# Patient Record
Sex: Male | Born: 1935 | Race: White | Hispanic: No | Smoking: Former smoker
Health system: Southern US, Community
[De-identification: ages and names within clinical notes are randomized; demographics above are authoritative.]

## PROBLEM LIST (undated history)

## (undated) DIAGNOSIS — I639 Cerebral infarction, unspecified: Secondary | ICD-10-CM

## (undated) DIAGNOSIS — I10 Essential (primary) hypertension: Secondary | ICD-10-CM

## (undated) DIAGNOSIS — F0390 Unspecified dementia without behavioral disturbance: Secondary | ICD-10-CM

## (undated) HISTORY — PX: PROSTATE SURGERY: SHX751

## (undated) HISTORY — PX: WRIST SURGERY: SHX841

---

## 1999-03-25 ENCOUNTER — Encounter: Payer: Self-pay | Admitting: Urology

## 1999-03-27 ENCOUNTER — Inpatient Hospital Stay (HOSPITAL_COMMUNITY): Admission: RE | Admit: 1999-03-27 | Discharge: 1999-03-30 | Payer: Self-pay | Admitting: Urology

## 2005-06-20 ENCOUNTER — Emergency Department (HOSPITAL_COMMUNITY): Admission: EM | Admit: 2005-06-20 | Discharge: 2005-06-20 | Payer: Self-pay | Admitting: Emergency Medicine

## 2006-07-14 ENCOUNTER — Ambulatory Visit (HOSPITAL_COMMUNITY): Admission: RE | Admit: 2006-07-14 | Discharge: 2006-07-14 | Payer: Self-pay | Admitting: Cardiology

## 2006-09-03 ENCOUNTER — Emergency Department (HOSPITAL_COMMUNITY): Admission: EM | Admit: 2006-09-03 | Discharge: 2006-09-03 | Payer: Self-pay | Admitting: Emergency Medicine

## 2008-04-17 ENCOUNTER — Emergency Department (HOSPITAL_COMMUNITY): Admission: EM | Admit: 2008-04-17 | Discharge: 2008-04-17 | Payer: Self-pay | Admitting: Emergency Medicine

## 2011-01-01 ENCOUNTER — Encounter (INDEPENDENT_AMBULATORY_CARE_PROVIDER_SITE_OTHER): Payer: Self-pay | Admitting: *Deleted

## 2011-01-08 NOTE — Letter (Signed)
Summary: New Patient letter  [facility]  [address]   [place], [place] [address]   Phone: [phone]  Fax: [phone]       01/01/2011 MRN: [id number]  [facility] [address] [place], [place]  [address]  Dear Mr. AUMAN,  Welcome to the Gastroenterology Division at [facility].    You are scheduled to see Dr.  Jarold Motto on 02/03/2011 at 8:30 on the 3rd floor at [facility], [address]  We ask that you try to arrive at our office 15 minutes prior to your appointment time to allow for check-in.  We would like you to complete the enclosed self-administered evaluation form prior to your visit and bring it with you on the day of your appointment.  We will review it with you.  Also, please bring a complete list of all your medications or, if you prefer, bring the medication bottles and we will list them.  Please bring your insurance card so that we may make a copy of it.  If your insurance requires a referral to see a specialist, please bring your referral form from your primary care physician.  Co-payments are due at the time of your visit and may be paid by cash, check or credit card.     Your office visit will consist of a consult with your physician (includes a physical exam), any laboratory testing he/she may order, scheduling of any necessary diagnostic testing (e.g. x-ray, ultrasound, CT-scan), and scheduling of a procedure (e.g. Endoscopy, Colonoscopy) if required.  Please allow enough time on your schedule to allow for any/all of these possibilities.    If you cannot keep your appointment, please call [phone] to cancel or reschedule prior to your appointment date.  This allows [place] the opportunity to schedule an appointment for another patient in need of care.  If you do not cancel or reschedule by 5 p.m. the business day prior to your appointment date, you will be charged a $50.00 late cancellation/no-show fee.    Thank you for choosing  [facility] for your medical needs.  We appreciate the opportunity to care for you.  Please visit [place] at our website  to learn more about our practice.                     Sincerely,                                                             The Gastroenterology Division

## 2011-01-29 DIAGNOSIS — I672 Cerebral atherosclerosis: Secondary | ICD-10-CM | POA: Insufficient documentation

## 2011-01-29 DIAGNOSIS — I6350 Cerebral infarction due to unspecified occlusion or stenosis of unspecified cerebral artery: Secondary | ICD-10-CM | POA: Insufficient documentation

## 2011-01-29 DIAGNOSIS — I10 Essential (primary) hypertension: Secondary | ICD-10-CM | POA: Insufficient documentation

## 2011-02-03 ENCOUNTER — Ambulatory Visit (INDEPENDENT_AMBULATORY_CARE_PROVIDER_SITE_OTHER): Payer: Medicare Other | Admitting: Gastroenterology

## 2011-02-03 ENCOUNTER — Other Ambulatory Visit: Payer: Self-pay | Admitting: Gastroenterology

## 2011-02-03 ENCOUNTER — Encounter: Payer: Self-pay | Admitting: Gastroenterology

## 2011-02-03 ENCOUNTER — Encounter (INDEPENDENT_AMBULATORY_CARE_PROVIDER_SITE_OTHER): Payer: Self-pay | Admitting: *Deleted

## 2011-02-03 ENCOUNTER — Other Ambulatory Visit: Payer: Medicare Other

## 2011-02-03 DIAGNOSIS — D509 Iron deficiency anemia, unspecified: Secondary | ICD-10-CM

## 2011-02-03 DIAGNOSIS — K219 Gastro-esophageal reflux disease without esophagitis: Secondary | ICD-10-CM

## 2011-02-03 DIAGNOSIS — R413 Other amnesia: Secondary | ICD-10-CM | POA: Insufficient documentation

## 2011-02-03 LAB — CBC WITH DIFFERENTIAL/PLATELET
Basophils Relative: 0.5 % (ref 0.0–3.0)
Eosinophils Relative: 2.5 % (ref 0.0–5.0)
Lymphocytes Relative: 29.4 % (ref 12.0–46.0)
Monocytes Relative: 7.1 % (ref 3.0–12.0)
Neutrophils Relative %: 60.5 % (ref 43.0–77.0)
RBC: 3.68 Mil/uL — ABNORMAL LOW (ref 4.22–5.81)
WBC: 5.8 10*3/uL (ref 4.5–10.5)

## 2011-02-03 LAB — BASIC METABOLIC PANEL
BUN: 30 mg/dL — ABNORMAL HIGH (ref 6–23)
Creatinine, Ser: 1.2 mg/dL (ref 0.4–1.5)
GFR: 59.07 mL/min — ABNORMAL LOW (ref >60.00)
Glucose, Bld: 117 mg/dL — ABNORMAL HIGH (ref 70–99)

## 2011-02-03 LAB — IBC PANEL
Iron: 103 ug/dL (ref 42–165)
Saturation Ratios: 36.7 % (ref 20.0–50.0)

## 2011-02-03 LAB — HEPATIC FUNCTION PANEL
ALT: 9 U/L (ref 0–53)
Alkaline Phosphatase: 44 U/L (ref 39–117)
Bilirubin, Direct: 0.1 mg/dL (ref 0.0–0.3)
Total Bilirubin: 0.7 mg/dL (ref 0.3–1.2)
Total Protein: 6.6 g/dL (ref 6.0–8.3)

## 2011-02-03 LAB — SEDIMENTATION RATE: Sed Rate: 38 mm/hr — ABNORMAL HIGH (ref 0–22)

## 2011-02-03 LAB — TSH: TSH: 1.68 u[IU]/mL (ref 0.35–5.50)

## 2011-02-04 DIAGNOSIS — E538 Deficiency of other specified B group vitamins: Secondary | ICD-10-CM | POA: Insufficient documentation

## 2011-02-12 NOTE — Assessment & Plan Note (Signed)
Summary: CONSULT FOR COLON.William Wade MEDICARE//SCH W/[name] [id number]//MEDLI...   History of Present Illness Visit Type: Initial Consult Primary GI MD: William Bison MD FACP FAGA Primary Provider: [name], MD Requesting Provider: [name], MD Chief Complaint: Patient here to discuss having a consult colonoscopy at age 77. He denies any current GI symptoms. History of Present Illness:   77 year old Caucasian male referred by Dr. Antionette Wade for evaluation of mild normochromic normocytic anemia with hematocrit of 36. The patient denies any GI complaints whatsoever except for chronic hoarseness which she attributes to her previous CVA several years ago. He apparently was not hospitalized and is not on anticoagulants or aspirin. He does complain of memory loss but is able to care for his wife and drive a car.  The patient specifically denies abdominal pain, melena, hematochezia, and/or or upper GI or hepatobiliary complaints. He denies anorexia, weight loss, or systemic complaints. His never had barium studies or endoscopic exams. Family history is noncontributory.   GI Review of Systems      Denies abdominal pain, acid reflux, belching, bloating, chest pain, dysphagia with liquids, dysphagia with solids, heartburn, loss of appetite, nausea, vomiting, vomiting blood, weight loss, and  weight gain.        Denies anal fissure, black tarry stools, change in bowel habit, constipation, diarrhea, diverticulosis, fecal incontinence, heme positive stool, hemorrhoids, irritable bowel syndrome, jaundice, light color stool, liver problems, rectal bleeding, and  rectal pain. Preventive Screening-Counseling & Management  Alcohol-Tobacco     Smoking Status: quit  Caffeine-Diet-Exercise     Does Patient Exercise: no    Current Medications (verified): 1)  None  Allergies (verified): No Known Drug Allergies  Past History:  Past medical, surgical, family and social histories (including risk  factors) reviewed for relevance to current acute and chronic problems.  Past Medical History: Current Problems:  MEMORY LOSS (ICD-780.93) ESSENTIAL HYPERTENSION, BENIGN (ICD-401.1) CVA (ICD-434.91) CEREBRAL ATHEROSCLEROSIS (ICD-437.0)  Past Surgical History: Cataract Extractions-bilateral Prostate Surgery  Family History: Reviewed history and no changes required. Patient unable to remember much family history Had 87 sisters  Social History: Reviewed history from 01/29/2011 and no changes required. Alcohol Use - no Illicit Drug Use - no Patient is a former smoker. -stopped 1985 Patient does not get regular exercise.  Smoking Status:  quit Does Patient Exercise:  no  Review of Systems General:  Denies fever, chills, sweats, anorexia, fatigue, weakness, malaise, weight loss, and sleep disorder. Eyes:  Denies blurring, diplopia, irritation, discharge, vision loss, scotoma, eye pain, and photophobia. CV:  Denies chest pains, angina, palpitations, syncope, dyspnea on exertion, orthopnea, PND, peripheral edema, and claudication. Resp:  Denies dyspnea at rest, dyspnea with exercise, cough, sputum, wheezing, coughing up blood, and pleurisy. GI:  Denies difficulty swallowing, pain on swallowing, nausea, indigestion/heartburn, vomiting, vomiting blood, abdominal pain, jaundice, gas/bloating, diarrhea, constipation, change in bowel habits, bloody BM's, black BMs, and fecal incontinence. GU:  Denies urinary burning, blood in urine, urinary frequency, urinary hesitancy, nocturnal urination, urinary incontinence, penile discharge, genital sores, decreased libido, and erectile dysfunction; previous TURP for BPH. He denies any current genitourinary complaints.. MS:  Denies joint pain / LOM, joint swelling, joint stiffness, joint deformity, low back pain, muscle weakness, muscle cramps, muscle atrophy, leg pain at night, leg pain with exertion, and shoulder pain / LOM hand / wrist pain (CTS). Neuro:   Complains of weakness and difficulty walking; denies paralysis, abnormal sensation, seizures, syncope, tremors, vertigo, transient blindness, frequent falls, frequent headaches, headache, sciatica, radiculopathy other:, restless legs,  memory loss, and confusion. Psych:  Complains of memory loss and confusion; denies depression, anxiety, suicidal ideation, hallucinations, paranoia, and phobia. Heme:  Denies bruising, bleeding, enlarged lymph nodes, and pagophagia.  Vital Signs:  Patient profile:   77 year old male Height:      74 inches Weight:      203 pounds BMI:     26.16 BSA:     2.19 Pulse rate:   76 / minute Pulse rhythm:   irregular BP sitting:   164 / 80  (left arm)  Vitals Entered By: Lamona Curl CMA Duncan Dull) (February 03, 2011 8:29 AM)  Physical Exam  General:  Well developed, well nourished, no acute distress. Head:  Normocephalic and atraumatic. Eyes:  PERRLA, no icterus.exam deferred to patient's ophthalmologist.   Mouth:  No deformity or lesions, dentition normal. Neck:  Supple; no masses or thyromegaly. Lungs:  Clear throughout to auscultation. Heart:  Regular rate and rhythm; no murmurs, rubs,  or bruits. Abdomen:  Soft, nontender and nondistended. No masses, hepatosplenomegaly or hernias noted. Normal bowel sounds. Rectal:  deferreddeferred until time of colonoscopy.   Msk:  Symmetrical with no gross deformities. Normal posture. Pulses:  Normal pulses noted. Extremities:  No clubbing, cyanosis, edema or deformities noted. Neurologic:  Alert and  oriented x4;  grossly normal neurologically.He appears to have fairly good short and long-term memory to my exam. Skin:  Intact without significant lesions or rashes. Cervical Nodes:  No significant cervical adenopathy. Psych:  Alert and cooperative. Normal mood and affect.   Impression & Recommendations:  Problem # 1:  GERD (ICD-530.81) Assessment Unchanged chronic hoarseness perhaps related to his previous CVA  versus acid reflux disease. Examination the oropharynx today was unremarkable but incomplete. I have empirically placed him on AcipHex 20 mg a day. Ideally he should have endoscopic exam. Orders: TLB-CBC Platelet - w/Differential (85025-CBCD) TLB-Hepatic/Liver Function Pnl (80076-HEPATIC) TLB-BMP (Basic Metabolic Panel-BMET) (80048-METABOL) TLB-TSH (Thyroid Stimulating Hormone) (84443-TSH) TLB-Ferritin (82728-FER) TLB-B12, Serum-Total ONLY ([address]-U04) TLB-Folic Acid (Folate) (82746-FOL) TLB-IBC Pnl (Iron/FE;Transferrin) (83550-IBC) TLB-Sedimentation Rate (ESR) (85652-ESR)  Problem # 2:  ANEMIA, IRON DEFICIENCY (ICD-280.9) Assessment: Unchanged repeat labs and anemia profile ordered.. Cards for detection of human hemoglobin in his stools also requested. Patient needs colonoscopy but he says he cannot perform such because he has no one that can stay with him during his exam would drive him home afterwards. This is a work in progress. Orders: TLB-CBC Platelet - w/Differential (85025-CBCD) TLB-Hepatic/Liver Function Pnl (80076-HEPATIC) TLB-BMP (Basic Metabolic Panel-BMET) (80048-METABOL) TLB-TSH (Thyroid Stimulating Hormone) (84443-TSH) TLB-Ferritin (82728-FER) TLB-B12, Serum-Total ONLY (54098-J19) TLB-Folic Acid (Folate) (82746-FOL) TLB-IBC Pnl (Iron/FE;Transferrin) (83550-IBC) TLB-Sedimentation Rate (ESR) (85652-ESR)  Problem # 3:  MEMORY LOSS (ICD-780.93) Assessment: Deteriorated neurology referral requested. Also repeat labs including B12 level ordered. With his history of previous TIAs, he probably should be on aspirin therapy. I will leave this up to his neurologist.He denies any history of cigarette or ethanol abuse. Family history is also unremarkable in terms of any known neurological problems. Orders: [facility]  Problem # 4:  ESSENTIAL HYPERTENSION, BENIGN (ICD-401.1) Assessment: Unchanged blood pressure today 164/80. I cannot see where he  is on any antihypertensive medication.  Problem # 5:  CEREBRAL ATHEROSCLEROSIS (ICD-437.0) Assessment: Comment Only  Patient Instructions: 1)  Copy sent to : [name], MD 2)  Please go to the basement today for your labs.  3)  Your prescription(s) have been sent to you pharmacy.   4)  Take Aciphex one by mouth once daily  5)  We will contact you about the referral to Neurologist, for your memory loss.  6)  The medication list was reviewed and reconciled.  All changed / newly prescribed medications were explained.  A complete medication list was provided to the patient / caregiver. Prescriptions: ACIPHEX 20 MG TBEC (RABEPRAZOLE SODIUM) take one by mouth once daily  #30 x 6   Entered by:   Harlow Mares CMA (AAMA)   Authorized by:   Mardella Layman MD [facility]   Signed by:   Mardella Layman MD [facility] on 02/03/2011   Method used:   Electronically to        [facility]. [phone]* (retail)       [address]       [place], [place]  [address]       Ph: [phone]       Fax: [phone]   RxID:   [id number]

## 2011-02-12 NOTE — Letter (Signed)
Summary: [facility]: Immunoassay Fecal Occult Blood (iFOB) Order Form  [facility]  [address] [place], [place] [address]   Phone: [phone]  Fax: [phone]      [facility]: Immunoassay Fecal Occult Blood (iFOB) Order Form   February 03, 2011 MRN: [id number]   TORIN MODICA 07/01/1925   Physicican Name: [name]  Diagnosis Code: 280.9     Harlow Mares CMA (AAMA)

## 2011-03-06 ENCOUNTER — Other Ambulatory Visit: Payer: PRIVATE HEALTH INSURANCE

## 2011-03-06 DIAGNOSIS — D509 Iron deficiency anemia, unspecified: Secondary | ICD-10-CM

## 2011-04-21 NOTE — Op Note (Signed)
NAMECHRISTIANJAMES, [name] NO.:  [id number]   MEDICAL RECORD NO.:  [id number]          PATIENT TYPE:  EMS   LOCATION:  [facility]JO                         FACILITY:  [facility]   PHYSICIAN:  William Wade, M.D.DATE OF BIRTH:  22-Oct-1925   DATE OF PROCEDURE:  04/17/2008  DATE OF DISCHARGE:  04/17/2008                               OPERATIVE REPORT   William Wade is an 77 year old male who presents to the [facility]  Emergency Room for evaluation of his left wrist.  He fell on  outstretched hand and sustained a displaced distal radius fracture.  I  was consulted by Dr. Cathren Wade to see and treat this patient.  I  appreciate the kind referral.  The patient denies Wade injury.  He is  alert and oriented.   The patient's past medical history is significant for two questionable  TIAs/stroke-type episodes.  He currently does not see a medical doctor  and does not take medicines.   MEDICINES:  None.   ALLERGIES:  None.   SOCIAL HISTORY:  He does not smoke or drink.  He lives with his wife for  many years.  He still drives a car and is independent.  He is a right-  hand dominant.   PAST SURGICAL HISTORY:  Prostate surgery 10 years ago.   PHYSICAL EXAMINATION:  On examination, he is alert and oriented in no  acute distress.  Vital signs are stable.  The patient has full sensation  to the hand.  Obvious deformity about the left wrist.  Elbow is stable.  There is no signs of infection, dystrophy, or vascular compromise.  He  is stable to ligamentous examination about the elbow, forearm, and  shoulder.  Obvious deformity about the wrist is noted with no evidence  of compartment syndrome.  Right upper extremity is neurovascularly  intact.  Normal alignment and normal range of motion.   X-rays reviewed, show displaced distal radius fracture, comminuted with  significant displacement.   IMPRESSION:  Comminuted greater than three-part distal radius fracture  left upper  extremity.   PLAN:  Verbally consented for reduction.  He was taken to procedural  region, underwent a hematoma block sterilely under my direction.  Following this, I performed a three-point mold technique and reduction  of the wrist.  Postreduction x-rays looked satisfactory.  I discussed  with him these issues and I discharged him on recommendations of ice,  elevation, finger range of motion, cautious use only return to see me in  7 days.  If this falls  and collapses, I would consider more aggressive approach such as  surgical intervention.  I have given Vicodin p.r.n. pain, and he  understands all do's and don't, etc.  It has been a pleasure to see him  today.  All questions have been encouraged and answered.           ______________________________  William Wade, M.D.     William Wade  D:  04/17/2008  T:  04/18/2008  Job:  [id number]

## 2013-06-28 ENCOUNTER — Emergency Department (HOSPITAL_COMMUNITY)
Admission: EM | Admit: 2013-06-28 | Discharge: 2013-06-28 | Disposition: A | Discharge status: Home / Self Care | Payer: Medicare Other | Attending: Emergency Medicine | Admitting: Emergency Medicine

## 2013-06-28 ENCOUNTER — Encounter (HOSPITAL_COMMUNITY): Payer: Self-pay | Admitting: Emergency Medicine

## 2013-06-28 ENCOUNTER — Emergency Department (HOSPITAL_COMMUNITY): Payer: Medicare Other

## 2013-06-28 DIAGNOSIS — Z87891 Personal history of nicotine dependence: Secondary | ICD-10-CM | POA: Insufficient documentation

## 2013-06-28 DIAGNOSIS — S61409A Unspecified open wound of unspecified hand, initial encounter: Secondary | ICD-10-CM | POA: Insufficient documentation

## 2013-06-28 DIAGNOSIS — Y929 Unspecified place or not applicable: Secondary | ICD-10-CM | POA: Insufficient documentation

## 2013-06-28 DIAGNOSIS — IMO0002 Reserved for concepts with insufficient information to code with codable children: Secondary | ICD-10-CM | POA: Insufficient documentation

## 2013-06-28 DIAGNOSIS — T148XXA Other injury of unspecified body region, initial encounter: Secondary | ICD-10-CM

## 2013-06-28 DIAGNOSIS — Z8673 Personal history of transient ischemic attack (TIA), and cerebral infarction without residual deficits: Secondary | ICD-10-CM | POA: Insufficient documentation

## 2013-06-28 DIAGNOSIS — Z23 Encounter for immunization: Secondary | ICD-10-CM | POA: Insufficient documentation

## 2013-06-28 DIAGNOSIS — Y939 Activity, unspecified: Secondary | ICD-10-CM | POA: Insufficient documentation

## 2013-06-28 HISTORY — DX: Cerebral infarction, unspecified: I63.9

## 2013-06-28 MED ORDER — TETANUS-DIPHTH-ACELL PERTUSSIS 5-2.5-18.5 LF-MCG/0.5 IM SUSP
0.5000 mL | Freq: Once | INTRAMUSCULAR | Status: AC
  Administered 2013-06-28: 0.5 mL via INTRAMUSCULAR
  Filled 2013-06-28: qty 0.5

## 2013-06-28 NOTE — ED Provider Notes (Signed)
History    88yM brought in by son for evaluation of skin tears to LUE. Pt says caught arm against the side of washing machine shortly before arrival. Denies significant pain. No other complaints. Unsure of last tetanus. Son has concerns though of pt's ability to take care of himself. Currently visiting pt from [place]. Pt very forgetful. House is disorganized. Started looking into placement options.   CSN: [id number] Arrival date & time 06/28/13  1144  First MD Initiated Contact with Patient 06/28/13 1320     Chief Complaint  Patient presents with  . Arm Injury   (Consider location/radiation/quality/duration/timing/severity/associated sxs/prior Treatment) HPI Past Medical History  Diagnosis Date  . Stroke    No past surgical history on file. No family history on file. History  Substance Use Topics  . Smoking status: Former Games developer  . Smokeless tobacco: Not on file  . Alcohol Use: No    Review of Systems  All systems reviewed and negative, other than as noted in HPI.   Allergies  Review of patient's allergies indicates no known allergies.  Home Medications  No current outpatient prescriptions on file. BP 181/68  Pulse 64  Temp(Src) 98.3 F (36.8 C) (Oral)  Resp 18  SpO2 100% Physical Exam  Nursing note and vitals reviewed. Constitutional: He appears well-developed and well-nourished. No distress.  HENT:  Head: Normocephalic and atraumatic.  Eyes: Conjunctivae are normal. Right eye exhibits no discharge. Left eye exhibits no discharge.  Neck: Neck supple.  Cardiovascular: Normal rate, regular rhythm and normal heart sounds.  Exam reveals no gallop and no friction rub.   No murmur heard. Pulmonary/Chest: Effort normal and breath sounds normal. No respiratory distress.  Abdominal: Soft. He exhibits no distension. There is no tenderness.  Musculoskeletal: He exhibits no edema and no tenderness.  Neurological: He is alert.  Skin: Skin is warm and dry.  "V" shaped  skin tear to dorsum of L hand. ~5cm in total length. Minimal underlying bony tenderness. NVI intact distally. Dime sized skin tear L elbow region. Pt able to actively fully extend L elbow. No bony tenderness.   Psychiatric: He has a normal mood and affect. His behavior is normal. Thought content normal.    ED Course  Procedures (including critical care time)  LACERATION REPAIR Performed by: Raeford Razor Authorized by: Raeford Razor Consent: Verbal consent obtained. Risks and benefits: risks, benefits and alternatives were discussed Consent given by: patient Patient identity confirmed: provided demographic data Prepped and Draped in normal sterile fashion Wound explored  Laceration Location: L hand  Laceration Length: 5cm  No Foreign Bodies seen or palpated  Anesthesia: none  Local anesthetic: n/a  Anesthetic total: n/q  Amount of cleaning: standard  Skin closure: steristrips  Number of sutures: 6  Technique: adhesive bandage  Patient tolerance: Patient tolerated the procedure well with no immediate complications.  Labs Reviewed - No data to display Dg Hand Complete Left  06/28/2013   *RADIOLOGY REPORT*  Clinical Data: Laceration left hand.  LEFT HAND - COMPLETE 3+ VIEW  Comparison: Plain films left wrist 04/17/2008.  Findings: No radiopaque foreign body is identified.  No acute fracture is seen.  An old healed distal radius fracture is noted. Scattered degenerative change most notable about the first [facility] joint is noted.  IMPRESSION:  1.  No acute abnormality. 2.  Old healed distal radius fracture.   Original Report Authenticated By: Holley Dexter, M.D.   1. Multiple skin tears     MDM  [phone] with  skin tears to LUE. Minimal bony tenderness of hand and imaging w/o fx. NV intact. Repaired with steristrips. Continued wound care. Tetanus updated. Pt lives alone and son with concerns with pt's ability to take care of self. Son visiting from [place]. No local family. Pt  with no PCP. Social work consulted to speak with pt/son in terms of possible additional assistance or alternative living arrangements.   Social work met with pt. Recommending determination of capacity. Explained to son that this is done by psychiatry and that potentially may be extended period of time before this is completed depending on how many consults are ahead of him. Son does not want to wait for this. I feel pt is medically stable for discharge. Home health was arranged prior to DC.   Raeford Razor, MD 07/01/13 [phone]

## 2013-06-28 NOTE — ED Notes (Signed)
Pt to ED from home via EMS for skin tear at left hand and posterior forearm below the elbow. Pt states he bumped his arm to something he does not remember. Pt denies fall. Per son, there is changes in pt mental status since 03/2013 that pt is more confused, not taking meds or follow up with PCP. BP-192/90, HR-82 and regular, CBG-103.

## 2013-06-28 NOTE — Progress Notes (Signed)
CSW met the son about his concerns with Pt's deteriorating mental capacity. Pt's son stated that since his mother passed he has noticed a decline in Pt's mental health and has made an attempt on placing Pt in a facility, however the Pt is unsure about placement at this time. Pt continues to decline and is unable to manage his finances. Pt has been found confused, however denies any memory loss.   Pt suffered an injury to his hand and was brought to the ED. Pt's son has requested a capacity screen to see if the Pt is able to remain on his own, as the [name]. [phone]) will need to return to [place] in the a.m. And is concerned about Pt's safety.   CSW notified ED MD and oncoming CSW for assist with Pt's sons request and possible placement.   Leron Croak, LCSWA [facility].  [phone]

## 2013-06-28 NOTE — ED Notes (Signed)
Social work at bedside.  

## 2013-06-28 NOTE — ED Notes (Signed)
Pt [facility].

## 2013-06-29 ENCOUNTER — Telehealth (HOSPITAL_COMMUNITY): Payer: Self-pay | Admitting: *Deleted

## 2013-06-29 NOTE — Progress Notes (Signed)
   CARE MANAGEMENT ED NOTE 06/29/2013  Patient:  William Wade, William Wade   Account Number:  [id number]  Date Initiated:  06/28/2013  Documentation initiated by:  Fransico Michael  Subjective/Objective Assessment:   presented to ED with arm lac     Subjective/Objective Assessment Detail:     Action/Plan:   disposition assistance   Action/Plan Detail:   Anticipated DC Date:  06/29/2013     Status Recommendation to Physician:   Result of Recommendation:     In-house referral  Clinical Social Worker   DC Planning Services  CM consult    Choice offered to / List presented to:       [facility] arranged  HH-2 PT  HH-1 RN  HH-6 SOCIAL WORKER      HH agency  [facility]    Status of service:    ED Comments:   ED Comments Detail:  06/28/13-1629-J.Issac Moure,RN,BSN [phone]      Spoke with Dr. Juleen China regarding home health needs. Orders noted for RN,PT, and CSW. Rinaldo Cloud, EDRN notified of plans. Lupita Leash, RN with [facility] notified of new referral. No further needs identified.  06/28/13-1623-J.Apollos Tenbrink,RN,BSN [phone]      In to speak with patient and son regarding potential need for home health services. Son reports that he is from out of state, will be returning home, and that patient has exhibited increasing weakness over last few weeks. Son reports that patient is unsteady and has difficulty maintaining balance at times. Patient uses walker for ambulation but doesn't feel that a walker would be more beneficial. Patient agreed to home health assessments and treatment. Choice of agencies given and [facility]. Will make appropriate referrals.  06/28/13-1617-J.Edis Huish,RN,BSN [phone]     Received referral from [place],RN regarding need for patient to be discharged home with home health services.

## 2013-11-28 ENCOUNTER — Emergency Department (HOSPITAL_COMMUNITY): Payer: Medicare Other

## 2013-11-28 ENCOUNTER — Encounter (HOSPITAL_COMMUNITY): Payer: Self-pay | Admitting: Emergency Medicine

## 2013-11-28 ENCOUNTER — Inpatient Hospital Stay (HOSPITAL_COMMUNITY)
Admission: EM | Admit: 2013-11-28 | Discharge: 2013-12-04 | DRG: 291 | Disposition: A | Discharge status: Skilled Nursing Facility | Payer: Medicare Other | Attending: Family Medicine | Admitting: Family Medicine

## 2013-11-28 DIAGNOSIS — I5021 Acute systolic (congestive) heart failure: Principal | ICD-10-CM

## 2013-11-28 DIAGNOSIS — Z515 Encounter for palliative care: Secondary | ICD-10-CM

## 2013-11-28 DIAGNOSIS — F0280 Dementia in other diseases classified elsewhere without behavioral disturbance: Secondary | ICD-10-CM

## 2013-11-28 DIAGNOSIS — I82402 Acute embolism and thrombosis of unspecified deep veins of left lower extremity: Secondary | ICD-10-CM

## 2013-11-28 DIAGNOSIS — Z66 Do not resuscitate: Secondary | ICD-10-CM | POA: Diagnosis present

## 2013-11-28 DIAGNOSIS — E875 Hyperkalemia: Secondary | ICD-10-CM

## 2013-11-28 DIAGNOSIS — J189 Pneumonia, unspecified organism: Secondary | ICD-10-CM

## 2013-11-28 DIAGNOSIS — R4182 Altered mental status, unspecified: Secondary | ICD-10-CM

## 2013-11-28 DIAGNOSIS — I672 Cerebral atherosclerosis: Secondary | ICD-10-CM

## 2013-11-28 DIAGNOSIS — J811 Chronic pulmonary edema: Secondary | ICD-10-CM

## 2013-11-28 DIAGNOSIS — D509 Iron deficiency anemia, unspecified: Secondary | ICD-10-CM | POA: Diagnosis present

## 2013-11-28 DIAGNOSIS — I6350 Cerebral infarction due to unspecified occlusion or stenosis of unspecified cerebral artery: Secondary | ICD-10-CM

## 2013-11-28 DIAGNOSIS — K219 Gastro-esophageal reflux disease without esophagitis: Secondary | ICD-10-CM

## 2013-11-28 DIAGNOSIS — R64 Cachexia: Secondary | ICD-10-CM | POA: Diagnosis present

## 2013-11-28 DIAGNOSIS — I509 Heart failure, unspecified: Secondary | ICD-10-CM

## 2013-11-28 DIAGNOSIS — E876 Hypokalemia: Secondary | ICD-10-CM

## 2013-11-28 DIAGNOSIS — IMO0002 Reserved for concepts with insufficient information to code with codable children: Secondary | ICD-10-CM

## 2013-11-28 DIAGNOSIS — R404 Transient alteration of awareness: Secondary | ICD-10-CM | POA: Diagnosis present

## 2013-11-28 DIAGNOSIS — R413 Other amnesia: Secondary | ICD-10-CM

## 2013-11-28 DIAGNOSIS — D649 Anemia, unspecified: Secondary | ICD-10-CM

## 2013-11-28 DIAGNOSIS — I10 Essential (primary) hypertension: Secondary | ICD-10-CM

## 2013-11-28 DIAGNOSIS — F0390 Unspecified dementia without behavioral disturbance: Secondary | ICD-10-CM | POA: Diagnosis present

## 2013-11-28 DIAGNOSIS — I824Y9 Acute embolism and thrombosis of unspecified deep veins of unspecified proximal lower extremity: Secondary | ICD-10-CM | POA: Diagnosis present

## 2013-11-28 DIAGNOSIS — G9340 Encephalopathy, unspecified: Secondary | ICD-10-CM

## 2013-11-28 DIAGNOSIS — E46 Unspecified protein-calorie malnutrition: Secondary | ICD-10-CM | POA: Diagnosis present

## 2013-11-28 DIAGNOSIS — I82409 Acute embolism and thrombosis of unspecified deep veins of unspecified lower extremity: Secondary | ICD-10-CM | POA: Diagnosis present

## 2013-11-28 DIAGNOSIS — I4892 Unspecified atrial flutter: Secondary | ICD-10-CM | POA: Diagnosis present

## 2013-11-28 DIAGNOSIS — Z8673 Personal history of transient ischemic attack (TIA), and cerebral infarction without residual deficits: Secondary | ICD-10-CM

## 2013-11-28 DIAGNOSIS — E538 Deficiency of other specified B group vitamins: Secondary | ICD-10-CM

## 2013-11-28 DIAGNOSIS — Z87891 Personal history of nicotine dependence: Secondary | ICD-10-CM

## 2013-11-28 HISTORY — DX: Unspecified dementia, unspecified severity, without behavioral disturbance, psychotic disturbance, mood disturbance, and anxiety: F03.90

## 2013-11-28 HISTORY — DX: Essential (primary) hypertension: I10

## 2013-11-28 LAB — CBC
HCT: 30.5 % — ABNORMAL LOW (ref 39.0–52.0)
MCH: 31.7 pg (ref 26.0–34.0)
MCV: 97.8 fL (ref 78.0–100.0)
Platelets: 192 10*3/uL (ref 150–400)
RBC: 3.12 MIL/uL — ABNORMAL LOW (ref 4.22–5.81)
RDW: 15.8 % — ABNORMAL HIGH (ref 11.5–15.5)
WBC: 8.8 10*3/uL (ref 4.0–10.5)

## 2013-11-28 LAB — CBC WITH DIFFERENTIAL/PLATELET
Basophils Absolute: 0 10*3/uL (ref 0.0–0.1)
Basophils Relative: 0 % (ref 0–1)
HCT: 32.3 % — ABNORMAL LOW (ref 39.0–52.0)
Hemoglobin: 10.8 g/dL — ABNORMAL LOW (ref 13.0–17.0)
Lymphocytes Relative: 10 % — ABNORMAL LOW (ref 12–46)
MCHC: 33.4 g/dL (ref 30.0–36.0)
Monocytes Absolute: 0.9 10*3/uL (ref 0.1–1.0)
Neutro Abs: 9.4 10*3/uL — ABNORMAL HIGH (ref 1.7–7.7)
Neutrophils Relative %: 83 % — ABNORMAL HIGH (ref 43–77)
RDW: 15.5 % (ref 11.5–15.5)
WBC: 11.3 10*3/uL — ABNORMAL HIGH (ref 4.0–10.5)

## 2013-11-28 LAB — URINALYSIS, ROUTINE W REFLEX MICROSCOPIC
Glucose, UA: NEGATIVE mg/dL
Hgb urine dipstick: NEGATIVE
Ketones, ur: 15 mg/dL — AB
Protein, ur: 30 mg/dL — AB
Urobilinogen, UA: 1 mg/dL (ref 0.0–1.0)

## 2013-11-28 LAB — TROPONIN I: Troponin I: <0.3 ng/mL (ref <0.30)

## 2013-11-28 LAB — COMPREHENSIVE METABOLIC PANEL
ALT: 24 U/L (ref 0–53)
AST: 32 U/L (ref 0–37)
Albumin: 2.9 g/dL — ABNORMAL LOW (ref 3.5–5.2)
Alkaline Phosphatase: 65 U/L (ref 39–117)
CO2: 25 mEq/L (ref 19–32)
Chloride: 105 mEq/L (ref 96–112)
Creatinine, Ser: 1.08 mg/dL (ref 0.50–1.35)
GFR calc non Af Amer: 60 mL/min — ABNORMAL LOW (ref >90)
Potassium: 3 mEq/L — ABNORMAL LOW (ref 3.5–5.1)
Total Bilirubin: 1.2 mg/dL (ref 0.3–1.2)

## 2013-11-28 LAB — URINE MICROSCOPIC-ADD ON

## 2013-11-28 LAB — RETICULOCYTES
RBC.: 3.12 MIL/uL — ABNORMAL LOW (ref 4.22–5.81)
Retic Count, Absolute: 96.7 10*3/uL (ref 19.0–186.0)
Retic Ct Pct: 3.1 % (ref 0.4–3.1)

## 2013-11-28 LAB — CREATININE, SERUM: GFR calc Af Amer: 72 mL/min — ABNORMAL LOW (ref >90)

## 2013-11-28 MED ORDER — ONDANSETRON HCL 4 MG PO TABS: 4.0000 mg | ORAL_TABLET | Freq: Four times a day (QID) | ORAL | Status: DC | PRN

## 2013-11-28 MED ORDER — SODIUM CHLORIDE 0.9 % IJ SOLN
3.0000 mL | Freq: Two times a day (BID) | INTRAMUSCULAR | Status: DC
  Administered 2013-11-28 – 2013-12-04 (×7): 3 mL via INTRAVENOUS

## 2013-11-28 MED ORDER — ONDANSETRON HCL 4 MG/2ML IJ SOLN: 4.0000 mg | Freq: Four times a day (QID) | INTRAMUSCULAR | Status: DC | PRN

## 2013-11-28 MED ORDER — ACETAMINOPHEN 325 MG PO TABS: 650.0000 mg | ORAL_TABLET | Freq: Four times a day (QID) | ORAL | Status: DC | PRN

## 2013-11-28 MED ORDER — POTASSIUM CHLORIDE CRYS ER 20 MEQ PO TBCR: 40.0000 meq | EXTENDED_RELEASE_TABLET | Freq: Once | ORAL | Status: DC

## 2013-11-28 MED ORDER — ENOXAPARIN SODIUM 40 MG/0.4ML ~~LOC~~ SOLN
40.0000 mg | SUBCUTANEOUS | Status: DC
  Administered 2013-11-28: 40 mg via SUBCUTANEOUS
  Filled 2013-11-28 (×2): qty 0.4

## 2013-11-28 MED ORDER — ACETAMINOPHEN 650 MG RE SUPP: 650.0000 mg | Freq: Four times a day (QID) | RECTAL | Status: DC | PRN

## 2013-11-28 MED ORDER — POTASSIUM CHLORIDE 10 MEQ/100ML IV SOLN
10.0000 meq | INTRAVENOUS | Status: AC
  Administered 2013-11-28 – 2013-11-29 (×4): 10 meq via INTRAVENOUS
  Filled 2013-11-28 (×4): qty 100

## 2013-11-28 MED ORDER — SODIUM CHLORIDE 0.9 % IJ SOLN
3.0000 mL | Freq: Two times a day (BID) | INTRAMUSCULAR | Status: DC
  Administered 2013-11-28 – 2013-12-03 (×6): 3 mL via INTRAVENOUS

## 2013-11-28 NOTE — H&P (Signed)
[facility] History and Physical  William Wade NWG:[id number] DOB: 1925/04/19 DOA: 11/28/2013  Referring physician: ER physician. PCP: William Fillers, MD as per patient's son PCP is not Dr. Jarold Wade.  Chief Complaint: Change in mental status.  History provider patient's son Mr. William Wade and patient's friend Mr. William Wade.  HPI: William Wade is a 79 y.o. male was brought to the ER the patient was found on the floor in his house. As per patient's son with whom I spoke over the phone patient has not been doing well for last 3 days and has been found to be having change in mental status has found the patient's grandsons 3 days ago. As per patient's friend Mr. William Wade patient had a fall yesterday. And today was found on the floor and found weak. He was brought to the ER. In the ER CT head did not show anything acute. Chest x-ray shows congestion concerning for CHF. Patient is on exam found to be mildly short of breath. He is oriented to his name and follows commands and nonfocal. Patient denies any chest pain abdominal pain nausea vomiting diarrhea. Patient does have history of progressive dementia which has worsened after his wife's death this 02/19/2023. Patient usually lives alone and drives. His finances are managed by son. He does not cook. Patient has not wanted to go to any physicians. His son has arranged house visiting physicians.  Review of Systems: As presented in the history of presenting illness, rest negative.  Past Medical History  Diagnosis Date  . Stroke   . Dementia    Past Surgical History  Procedure Laterality Date  . Wrist surgery    . Prostate surgery     Social History:  reports that he has quit smoking. He does not have any smokeless tobacco history on file. He reports that he does not drink alcohol or use illicit drugs. Where does patient live home. Can patient participate in ADLs? Yes.  No Known Allergies  Family History:  Family History  Problem Relation  Age of Onset  . Lung cancer Father       Prior to Admission medications   Not on File    Physical Exam: Filed Vitals:   11/28/13 1745 11/28/13 1845 11/28/13 1945 11/28/13 2045  BP: 154/71 141/70 144/69 149/69  Pulse: 81 73 76 80  Temp:      TempSrc:      Resp: 14 15 18 17   SpO2: 99% 96% 98% 100%     General:  Well developed and nourished.  Eyes: Anicteric no pallor.  ENT: No discharge from ears eyes nose mouth.  Neck: No mass felt.  Cardiovascular: S1-S2 heard.  Respiratory: Mild expiratory wheeze heard and crepitations.  Abdomen: Soft nontender bowel sounds present.  Skin: No rash.  Musculoskeletal: Bilateral lower extremity edema.  Psychiatric: Patient is oriented to his name and follows commands.  Neurologic: Patient is oriented to his name and follows commands and moves all extremities 5 x 5. No facial asymmetry. Tongue is midline.  Labs on Admission:  Basic Metabolic Panel:  Recent Labs Lab 11/28/13 1446  NA 143  K 3.0*  CL 105  CO2 25  GLUCOSE 121*  BUN 37*  CREATININE 1.08  CALCIUM 8.8   Liver Function Tests:  Recent Labs Lab 11/28/13 1446  AST 32  ALT 24  ALKPHOS 65  BILITOT 1.2  PROT 6.1  ALBUMIN 2.9*   No results found for this basename: LIPASE, AMYLASE,  in the last 168 hours No  results found for this basename: AMMONIA,  in the last 168 hours CBC:  Recent Labs Lab 11/28/13 1446  WBC 11.3*  NEUTROABS 9.4*  HGB 10.8*  HCT 32.3*  MCV 97.6  PLT 185   Cardiac Enzymes:  Recent Labs Lab 11/28/13 1446  TROPONINI <0.30    BNP (last 3 results)  Recent Labs  11/28/13 1446  PROBNP 12187.0*   CBG: No results found for this basename: GLUCAP,  in the last 168 hours  Radiological Exams on Admission: Ct Head Wo Contrast  11/28/2013   CLINICAL DATA:  Altered mental status, history of stroke  EXAM: CT HEAD WITHOUT CONTRAST  TECHNIQUE: Contiguous axial images were obtained from the base of the skull through the vertex  without intravenous contrast.  COMPARISON:  MRI brain dated 07/14/2006  FINDINGS: No evidence of parenchymal hemorrhage or extra-axial fluid collection. No mass lesion, mass effect, or midline shift.  No CT evidence of acute infarction. Encephalomalacic changes related to prior left MCA distribution infarct.  Subcortical white matter and periventricular small vessel ischemic changes. Intracranial atherosclerosis.  Global cortical atrophy.  No ventriculomegaly.  The visualized paranasal sinuses are essentially clear. The mastoid air cells are unopacified.  No evidence of calvarial fracture.  IMPRESSION: No evidence of acute intracranial abnormality.  Encephalomalacic changes related to prior left MCA distribution infarct.  Atrophy with small vessel ischemic changes and intracranial atherosclerosis.   Electronically Signed   By: Charline Bills M.D.   On: 11/28/2013 19:26   Dg Chest Port 1 View  11/28/2013   CLINICAL DATA:  Acute mental status changes.  EXAM: PORTABLE CHEST - 1 VIEW  COMPARISON:  None.  FINDINGS: The lung bases are not included on this study. Visualized portion of the long bones demonstrate enlargement of the cardiac silhouette. There is prominence of the interstitial markings, indistinctness of the pulmonary vasculature, and peribronchial cuffing. The osseous structures demonstrate degenerative changes within the right lobe shoulders. No focal regions of consolidation appreciated.  IMPRESSION: Interstitial infiltrate likely reflecting pulmonary edema within the visualized portions of the chest.   Electronically Signed   By: Salome Holmes M.D.   On: 11/28/2013 15:40    EKG: Independently reviewed. Normal sinus rhythm with PVCs. QTC is 517.  Assessment/Plan Active Problems:   Acute encephalopathy   CHF (congestive heart failure)   Hypokalemia   Anemia   History of CVA (cerebrovascular accident)   1. Acute encephalopathy in a patient with dementia - CT head was negative for anything  acute. We will check RPR TSH and B12 and folate levels. Check ammonia levels an MRI brain. Patient is afebrile and does not look infected. 2. Decompensated CHF - patient has bilateral lower extremity edema and on exam patient does have wheeze and crepitations. After correcting potassium patient is to be placed on Lasix IV. Cycle cardiac markers. Check 2-D echo. 3. Hypokalemia - replace and recheck. Check magnesium levels. 4. Anemia - as per patient's son patient has chronic anemia. Per the notes reviewed in the chart patient has previously refused a colonoscopy. Check anemia panel and closely follow CBC. 5. Previous history of CVA - presently I have ordered MRI brain for his change in mental status.    Code Status: DO NOT RESUSCITATE. Family Communication: Discussed with patient's son Mr. William Wade over the phone. Mr. William Wade is also the patient's health care power of attorney. Could be reached at [phone].  Disposition Plan: Admit to inpatient.    William Wade N. [facility] Pager [phone].  If 7PM-7AM, please contact night-coverage [email or web address] Password [facility] 11/28/2013, 9:19 PM

## 2013-11-28 NOTE — ED Notes (Signed)
Pt arrives via [facility], pt was found home by family friend "kneeling over" and couldn't get up. Pt was covered in feces, white coating on tongue, and dry membranes EMS reports. Pt son states pt has hx of "dementia" and lives alone, refuses to take medicines, see doctors, or go to a nursing home after wife/caregiver passed away. Pt has hx of stroke 7 years ago according to son. Pt is alert and oriented, nad noted.

## 2013-11-28 NOTE — ED Notes (Signed)
Spoke with William Wade in lab who states she will add BNP to blood samples already obtained.

## 2013-11-28 NOTE — ED Notes (Signed)
Stroke swallow screen done at 1524

## 2013-11-28 NOTE — ED Notes (Signed)
Called lab in regards to BNP results, Alison Stalling states she will add it on to blood samples already obtained. Dr. Loretha Stapler made aware of delay.

## 2013-11-28 NOTE — ED Provider Notes (Signed)
CSN: [id number]     Arrival date & time 11/28/13  1411 History   First MD Initiated Contact with Patient 11/28/13 1420     Chief Complaint  Patient presents with  . Altered Mental Status   (Consider location/radiation/quality/duration/timing/severity/associated sxs/prior Treatment) Patient is a 79 y.o. male presenting with altered mental status.  Altered Mental Status Presenting symptoms: confusion   Presenting symptoms comment:  Weakness Severity:  Unable to specify Episode history:  Unable to specify Timing:  Unable to specify Context: dementia   Context comment:  Lives at home alone Associated symptoms: no abdominal pain, no fever and no vomiting   Associated symptoms comment:  Denies chest pain, cough, SOB, abdominal pain   Past Medical History  Diagnosis Date  . Stroke    History reviewed. No pertinent past surgical history. History reviewed. No pertinent family history. History  Substance Use Topics  . Smoking status: Former Games developer  . Smokeless tobacco: Not on file  . Alcohol Use: No    Review of Systems  Constitutional: Negative for fever.  HENT: Negative for congestion.   Respiratory: Negative for cough and shortness of breath.   Cardiovascular: Negative for chest pain.  Gastrointestinal: Negative for vomiting and abdominal pain.  Psychiatric/Behavioral: Positive for confusion.  All other systems reviewed and are negative.    Allergies  Review of patient's allergies indicates no known allergies.  Home Medications  No current outpatient prescriptions on file. BP 117/47  Pulse 85  Temp(Src) 97.7 F (36.5 C) (Oral)  Resp 24  SpO2 98% Physical Exam  Nursing note and vitals reviewed. Constitutional: He appears well-developed and well-nourished. No distress.  Elderly  HENT:  Head: Normocephalic and atraumatic.  Mouth/Throat: Oropharynx is clear and moist.  Eyes: Conjunctivae are normal. Pupils are equal, round, and reactive to light. No scleral  icterus.  Neck: Neck supple.  Cardiovascular: Normal rate, regular rhythm, normal heart sounds and intact distal pulses.   No murmur heard. Pulmonary/Chest: Effort normal and breath sounds normal. No stridor. No respiratory distress. He has no wheezes. He has no rales.  Abdominal: Soft. He exhibits no distension. There is no tenderness.  Musculoskeletal: Normal range of motion. He exhibits edema (2+ BLE).  Neurological: He is alert. He is disoriented (disoriented to place, time, situation).  Skin: Skin is warm and dry. No rash noted.  Psychiatric: He has a normal mood and affect. His behavior is normal.    ED Course  Procedures (including critical care time) Labs Review Labs Reviewed  CBC WITH DIFFERENTIAL - Abnormal; Notable for the following:    WBC 11.3 (*)    RBC 3.31 (*)    Hemoglobin 10.8 (*)    HCT 32.3 (*)    Neutrophils Relative % 83 (*)    Neutro Abs 9.4 (*)    Lymphocytes Relative 10 (*)    All other components within normal limits  COMPREHENSIVE METABOLIC PANEL - Abnormal; Notable for the following:    Potassium 3.0 (*)    Glucose, Bld 121 (*)    BUN 37 (*)    Albumin 2.9 (*)    GFR calc non Af Amer 60 (*)    GFR calc Af Amer 70 (*)    All other components within normal limits  URINALYSIS, ROUTINE W REFLEX MICROSCOPIC - Abnormal; Notable for the following:    Color, Urine AMBER (*)    Bilirubin Urine SMALL (*)    Ketones, ur 15 (*)    Protein, ur 30 (*)  All other components within normal limits  PRO B NATRIURETIC PEPTIDE - Abnormal; Notable for the following:    Pro B Natriuretic peptide (BNP) 12187.0 (*)    All other components within normal limits  URINE MICROSCOPIC-ADD ON - Abnormal; Notable for the following:    Squamous Epithelial / LPF MANY (*)    All other components within normal limits  TROPONIN I   Imaging Review Ct Head Wo Contrast  11/28/2013   CLINICAL DATA:  Altered mental status, history of stroke  EXAM: CT HEAD WITHOUT CONTRAST   TECHNIQUE: Contiguous axial images were obtained from the base of the skull through the vertex without intravenous contrast.  COMPARISON:  MRI brain dated 07/14/2006  FINDINGS: No evidence of parenchymal hemorrhage or extra-axial fluid collection. No mass lesion, mass effect, or midline shift.  No CT evidence of acute infarction. Encephalomalacic changes related to prior left MCA distribution infarct.  Subcortical white matter and periventricular small vessel ischemic changes. Intracranial atherosclerosis.  Global cortical atrophy.  No ventriculomegaly.  The visualized paranasal sinuses are essentially clear. The mastoid air cells are unopacified.  No evidence of calvarial fracture.  IMPRESSION: No evidence of acute intracranial abnormality.  Encephalomalacic changes related to prior left MCA distribution infarct.  Atrophy with small vessel ischemic changes and intracranial atherosclerosis.   Electronically Signed   By: Charline Bills M.D.   On: 11/28/2013 19:26   Dg Chest Port 1 View  11/28/2013   CLINICAL DATA:  Acute mental status changes.  EXAM: PORTABLE CHEST - 1 VIEW  COMPARISON:  None.  FINDINGS: The lung bases are not included on this study. Visualized portion of the long bones demonstrate enlargement of the cardiac silhouette. There is prominence of the interstitial markings, indistinctness of the pulmonary vasculature, and peribronchial cuffing. The osseous structures demonstrate degenerative changes within the right lobe shoulders. No focal regions of consolidation appreciated.  IMPRESSION: Interstitial infiltrate likely reflecting pulmonary edema within the visualized portions of the chest.   Electronically Signed   By: Salome Holmes M.D.   On: 11/28/2013 15:40  All radiology studies independently viewed by me.     EKG Interpretation    Date/Time:  Tuesday November 28 2013 14:19:46 EST Ventricular Rate:  87 PR Interval:  176 QRS Duration: 97 QT Interval:  430 QTC Calculation: 517 R  Axis:   -60 Text Interpretation:  Sinus rhythm Ventricular premature complex Inferior infarct, old Anteroseptal infarct, age indeterminate Prolonged QT interval pvc is new compared to prior QTc is more prolonged compared to prior Confirmed by [facility]  MD, TREY ([id number]) on 11/28/2013 7:41:52 PM            MDM   1. Altered mental status   2. Pulmonary edema    79 yo male with reported hx of dementia presenting because a friend found him weak, disheveled, and covered in excrement.  He is alert but disoriented.  I spoke with his friend Elisha Headland) who reported a fall yesterday and noted him to be very weak today, unable to walk or take care of himself.  I also spoke with his son Mikie Misner) via telephone (he lives in [place]) who reports that pt has fairly good functionality at baseline.  His workup in the ED was notable for pulm edema on CXR, elevated BNP, negative troponin, and mild hypokalemia.  Lasix deferred for now while replacing potassium as his respiratory status has remained stable.  Consulted [facility] for admission.      Candyce Churn, MD  11/28/13 2032 

## 2013-11-28 NOTE — ED Notes (Signed)
Spoke with pt and his friend, Bethann Goo re: pt's home situation.  Gardiner Barefoot states that pt was doing well on Sunday when he last visited him and that he has had a sudden decline in his functioning.  Pt has no complaints, but his friend does not think he can walk (pt uses walker at baseline.) Pt refusing placement at this time, but would be agreeable to [facility] if appropriate.  Gardiner Barefoot states he would be willing to take care of pt if son would be agreeable.  Son lives in [place] and pt has no local family.  Pt/friend unsure re: pt's finances to pay for custodial care/ALF care etc.  Per RN, pt is still being worked up, CSW will revisit/follow up with son once disposition is known.

## 2013-11-28 NOTE — ED Notes (Signed)
Simon Llamas (Son) [phone]  Bethann Goo [facility] Friend) [phone]

## 2013-11-29 ENCOUNTER — Encounter (HOSPITAL_COMMUNITY): Payer: Self-pay | Admitting: Physician Assistant

## 2013-11-29 ENCOUNTER — Inpatient Hospital Stay (HOSPITAL_COMMUNITY): Payer: Medicare Other

## 2013-11-29 DIAGNOSIS — I5023 Acute on chronic systolic (congestive) heart failure: Secondary | ICD-10-CM

## 2013-11-29 DIAGNOSIS — I059 Rheumatic mitral valve disease, unspecified: Secondary | ICD-10-CM

## 2013-11-29 DIAGNOSIS — R609 Edema, unspecified: Secondary | ICD-10-CM

## 2013-11-29 DIAGNOSIS — I5021 Acute systolic (congestive) heart failure: Principal | ICD-10-CM

## 2013-11-29 DIAGNOSIS — R4182 Altered mental status, unspecified: Secondary | ICD-10-CM

## 2013-11-29 LAB — CBC WITH DIFFERENTIAL/PLATELET
Basophils Relative: 0 % (ref 0–1)
Eosinophils Absolute: 0.1 10*3/uL (ref 0.0–0.7)
HCT: 31.1 % — ABNORMAL LOW (ref 39.0–52.0)
Hemoglobin: 10.2 g/dL — ABNORMAL LOW (ref 13.0–17.0)
Lymphs Abs: 2 10*3/uL (ref 0.7–4.0)
MCH: 32.3 pg (ref 26.0–34.0)
MCHC: 32.8 g/dL (ref 30.0–36.0)
Monocytes Absolute: 0.8 10*3/uL (ref 0.1–1.0)
Monocytes Relative: 9 % (ref 3–12)
Neutrophils Relative %: 65 % (ref 43–77)
Platelets: 191 10*3/uL (ref 150–400)
RBC: 3.16 MIL/uL — ABNORMAL LOW (ref 4.22–5.81)

## 2013-11-29 LAB — COMPREHENSIVE METABOLIC PANEL
ALT: 21 U/L (ref 0–53)
AST: 27 U/L (ref 0–37)
Albumin: 2.7 g/dL — ABNORMAL LOW (ref 3.5–5.2)
CO2: 26 mEq/L (ref 19–32)
Calcium: 8.5 mg/dL (ref 8.4–10.5)
Creatinine, Ser: 1.13 mg/dL (ref 0.50–1.35)
GFR calc non Af Amer: 57 mL/min — ABNORMAL LOW (ref >90)
Sodium: 144 mEq/L (ref 135–145)
Total Protein: 5.8 g/dL — ABNORMAL LOW (ref 6.0–8.3)

## 2013-11-29 LAB — IRON AND TIBC
Iron: 22 ug/dL — ABNORMAL LOW (ref 42–135)
UIBC: 181 ug/dL (ref 125–400)

## 2013-11-29 LAB — TROPONIN I
Troponin I: 0.7 ng/mL (ref <0.30)
Troponin I: <0.3 ng/mL (ref <0.30)

## 2013-11-29 LAB — TSH: TSH: 1.137 u[IU]/mL (ref 0.350–4.500)

## 2013-11-29 LAB — RPR: RPR Ser Ql: NONREACTIVE

## 2013-11-29 LAB — FERRITIN: Ferritin: 153 ng/mL (ref 22–322)

## 2013-11-29 MED ORDER — FUROSEMIDE 10 MG/ML IJ SOLN
40.0000 mg | Freq: Two times a day (BID) | INTRAMUSCULAR | Status: DC
  Administered 2013-11-29 – 2013-11-30 (×2): 40 mg via INTRAVENOUS
  Filled 2013-11-29 (×4): qty 4

## 2013-11-29 MED ORDER — FUROSEMIDE 10 MG/ML IJ SOLN
20.0000 mg | Freq: Every day | INTRAMUSCULAR | Status: DC
  Filled 2013-11-29: qty 2

## 2013-11-29 MED ORDER — ENALAPRIL MALEATE 2.5 MG PO TABS
2.5000 mg | ORAL_TABLET | Freq: Every day | ORAL | Status: DC
  Administered 2013-11-29 – 2013-11-30 (×2): 2.5 mg via ORAL
  Filled 2013-11-29 (×2): qty 1

## 2013-11-29 MED ORDER — ASPIRIN EC 81 MG PO TBEC
81.0000 mg | DELAYED_RELEASE_TABLET | Freq: Every day | ORAL | Status: DC
  Administered 2013-11-29 – 2013-12-04 (×6): 81 mg via ORAL
  Filled 2013-11-29 (×6): qty 1

## 2013-11-29 MED ORDER — ENOXAPARIN SODIUM 80 MG/0.8ML ~~LOC~~ SOLN
1.0000 mg/kg | Freq: Two times a day (BID) | SUBCUTANEOUS | Status: DC
  Administered 2013-11-30 – 2013-12-03 (×8): 80 mg via SUBCUTANEOUS
  Filled 2013-11-29 (×10): qty 0.8

## 2013-11-29 MED ORDER — POTASSIUM CHLORIDE CRYS ER 20 MEQ PO TBCR
40.0000 meq | EXTENDED_RELEASE_TABLET | Freq: Two times a day (BID) | ORAL | Status: DC
  Administered 2013-11-29 – 2013-12-01 (×4): 40 meq via ORAL
  Filled 2013-11-29 (×5): qty 2

## 2013-11-29 MED ORDER — ENOXAPARIN SODIUM 80 MG/0.8ML ~~LOC~~ SOLN
1.0000 mg/kg | Freq: Two times a day (BID) | SUBCUTANEOUS | Status: AC
  Administered 2013-11-29 (×2): 80 mg via SUBCUTANEOUS
  Filled 2013-11-29 (×2): qty 0.8

## 2013-11-29 MED ORDER — ENSURE COMPLETE PO LIQD
237.0000 mL | Freq: Three times a day (TID) | ORAL | Status: DC
  Administered 2013-11-29 – 2013-12-04 (×7): 237 mL via ORAL

## 2013-11-29 MED ORDER — FUROSEMIDE 10 MG/ML IJ SOLN
20.0000 mg | Freq: Once | INTRAMUSCULAR | Status: AC
  Administered 2013-11-30: 20 mg via INTRAVENOUS

## 2013-11-29 NOTE — Progress Notes (Signed)
[facility] PROGRESS NOTE  William Wade ZOX:[id number] DOB: 08-Aug-1925 DOA: 11/28/2013 PCP: Garlan Fillers, MD  Assessment/Plan: 1-Acute encephalopathy in a patient with dementia ; MRI negative for stroke. UA negative. Chest x ray; pulmonary edema. TSH normal at 1.1,  B-12 at 1040, ammonia at 10.  2-Acute Systolic Heart Failure; Started on IV lasix 40 mg IV BID. Appreciate Dr Gala Romney assistance. Mildly increase troponin. ECHO with Ef 25 to 30 %.  3-Hypokalemia : replete.  4-Anemia - as per patient's son patient has chronic anemia. Per the notes reviewed in the chart patient has previously refused a colonoscopy. Check anemia panel and closely follow CBC.  5-Previous history of CVA - MRI negative for acute stroke.  6-left leg is positive for a non-occlusive short segment of deep vein thrombosis in the distal common femoral vein; start Lovenox , pharmacy to dose.     Code Status: DNR.  Family Communication:  Disposition Plan: Remain in patient.    Consultants:  Cardiology  Procedures: ECHO: Left ventricle: The cavity size was mildly dilated. Wall thickness was normal. Systolic function was severely reduced. The estimated ejection fraction was in the range of 25% to 30%. Diffuse hypokinesis. There is akinesis of the inferior posterior myocardium. Features are consistent with a pseudonormal left ventricular filling pattern, with concomitant abnormal relaxation and increased filling pressure (grade 2 diastolic dysfunction). - Mitral valve: Moderate to severe regurgitation. - Left atrium: The atrium was mildly dilated. - Right atrium: The atrium was mildly dilated. - Pulmonary arteries: Systolic pressure was mildly to moderately increased. PA peak pressure: 50mm Hg (S).    Antibiotics:  none  HPI/Subjective: Alert, sitting in bed, denies dyspnea or chest pain.   Objective: Filed Vitals:   11/29/13 1258  BP: 142/69  Pulse: 86  Temp: 97.5 F (36.4 C)  Resp: 18     Intake/Output Summary (Last 24 hours) at 11/29/13 1619 Last data filed at 11/29/13 1259  Gross per 24 hour  Intake    480 ml  Output    650 ml  Net   -170 ml   Filed Weights   11/28/13 2148 11/29/13 0529  Weight: 82.283 kg (181 lb 6.4 oz) 82.283 kg (181 lb 6.4 oz)    Exam:   General:  Alert in no acute distress.   Cardiovascular: S 1, S 2 RRR  Respiratory: Crackles bilaterally.   Abdomen: BS present, soft, Nt  Musculoskeletal: bilateral LE edema.   Data Reviewed: Basic Metabolic Panel:  Recent Labs Lab 11/28/13 1446 11/28/13 2300 11/29/13 0450  NA 143  --  144  K 3.0*  --  3.6  CL 105  --  107  CO2 25  --  26  GLUCOSE 121*  --  94  BUN 37*  --  34*  CREATININE 1.08 1.05 1.13  CALCIUM 8.8  --  8.5   Liver Function Tests:  Recent Labs Lab 11/28/13 1446 11/29/13 0450  AST 32 27  ALT 24 21  ALKPHOS 65 59  BILITOT 1.2 1.2  PROT 6.1 5.8*  ALBUMIN 2.9* 2.7*   No results found for this basename: LIPASE, AMYLASE,  in the last 168 hours  Recent Labs Lab 11/28/13 2300  AMMONIA 10*   CBC:  Recent Labs Lab 11/28/13 1446 11/28/13 2300 11/29/13 0450  WBC 11.3* 8.8 8.2  NEUTROABS 9.4*  --  5.3  HGB 10.8* 9.9* 10.2*  HCT 32.3* 30.5* 31.1*  MCV 97.6 97.8 98.4  PLT 185 192 191   Cardiac Enzymes:  Recent Labs Lab 11/28/13 1446 11/28/13 2300 11/29/13 0450 11/29/13 0945  TROPONINI <0.30 0.70* 0.46* <0.30   BNP (last 3 results)  Recent Labs  11/28/13 1446  PROBNP 12187.0*   CBG: No results found for this basename: GLUCAP,  in the last 168 hours  No results found for this or any previous visit (from the past 240 hour(s)).   Studies: Ct Head Wo Contrast  11/28/2013   CLINICAL DATA:  Altered mental status, history of stroke  EXAM: CT HEAD WITHOUT CONTRAST  TECHNIQUE: Contiguous axial images were obtained from the base of the skull through the vertex without intravenous contrast.  COMPARISON:  MRI brain dated 07/14/2006  FINDINGS: No  evidence of parenchymal hemorrhage or extra-axial fluid collection. No mass lesion, mass effect, or midline shift.  No CT evidence of acute infarction. Encephalomalacic changes related to prior left MCA distribution infarct.  Subcortical white matter and periventricular small vessel ischemic changes. Intracranial atherosclerosis.  Global cortical atrophy.  No ventriculomegaly.  The visualized paranasal sinuses are essentially clear. The mastoid air cells are unopacified.  No evidence of calvarial fracture.  IMPRESSION: No evidence of acute intracranial abnormality.  Encephalomalacic changes related to prior left MCA distribution infarct.  Atrophy with small vessel ischemic changes and intracranial atherosclerosis.   Electronically Signed   By: Charline Bills M.D.   On: 11/28/2013 19:26   Mr Brain Wo Contrast  11/29/2013   CLINICAL DATA:  Altered mental status. Acute encephalopathy. Dementia. Evaluate for stroke.  EXAM: MRI HEAD WITHOUT CONTRAST  TECHNIQUE: Multiplanar, multiecho pulse sequences of the brain and surrounding structures were obtained without intravenous contrast.  COMPARISON:  CT head 11/28/2013.  MR head 07/14/2006.  FINDINGS: The patient was unable to remain motionless for the exam. Small or subtle lesions could be overlooked.  Chronic encephalomalacia related to old left MCA territory infarction affecting much of the left hemisphere including the frontal, temporal, and parietal lobes, including the basal ganglia. No acute stroke is evident. There is no acute hemorrhage, mass lesion, hydrocephalus, or extra-axial fluid. Generalized cerebral and cerebellar atrophy. Chronic microvascular ischemic change affects the periventricular and subcortical white matter. Prominent perivascular spaces suggest chronic hypertension.  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 Some flow  void signal is seen in the left MCA trunk. This LICA vessel occlusion was not present in 2007.  Pituitary, pineal, and cerebellar tonsils unremarkable. No upper cervical lesions. There may be slight chronic hemorrhage associated with the old left basal ganglia infarct.  No osseous lesions are evident. Upper cervical region unremarkable. Bilateral cataract extraction. No sinus or mastoid disease.  Compared with 2007, there has been progressive atrophy. The left MCA hemispheric infarctions were acute at that time. The left basal ganglia infarct was present before 2007.  IMPRESSION: No acute stroke or other acute intracranial findings.  Remote left MCA territory infarction.  Occluded left internal carotid artery has occurred since 2007. There appears to be collateral flow into the left anterior circulation based on flow void signal within the left MCA.   Electronically Signed   By: Davonna Belling M.D.   On: 11/29/2013 09:53   Dg Chest Port 1 View  11/28/2013   CLINICAL DATA:  Acute mental status changes.  EXAM: PORTABLE CHEST - 1 VIEW  COMPARISON:  None.  FINDINGS: The lung bases are not included on this study. Visualized portion of the long  bones demonstrate enlargement of the cardiac silhouette. There is prominence of the interstitial markings, indistinctness of the pulmonary vasculature, and peribronchial cuffing. The osseous structures demonstrate degenerative changes within the right lobe shoulders. No focal regions of consolidation appreciated.  IMPRESSION: Interstitial infiltrate likely reflecting pulmonary edema within the visualized portions of the chest.   Electronically Signed   By: Salome Holmes M.D.   On: 11/28/2013 15:40    Scheduled Meds: . enoxaparin (LOVENOX) injection  1 mg/kg Subcutaneous BID  . [START ON 11/30/2013] enoxaparin (LOVENOX) injection  1 mg/kg Subcutaneous Q12H  . feeding supplement (ENSURE COMPLETE)  237 mL Oral TID BM  . furosemide  40 mg Intravenous BID  . potassium  chloride  40 mEq Oral BID  . sodium chloride  3 mL Intravenous Q12H  . sodium chloride  3 mL Intravenous Q12H   Continuous Infusions:   Active Problems:   Acute encephalopathy   CHF (congestive heart failure)   Hypokalemia   Anemia   History of CVA (cerebrovascular accident)    Time spent: 30 minutes.     Tynslee Bowlds  [facility] Pager [phone]. If 7PM-7AM, please contact night-coverage at [email or web address], password [facility] 11/29/2013, 4:19 PM  LOS: 1 day

## 2013-11-29 NOTE — Progress Notes (Signed)
ANTICOAGULATION CONSULT NOTE - Initial Consult  Pharmacy Consult for lovenox Indication: DVT  No Known Allergies  Patient Measurements: Height: 6\' 2"  (188 cm) Weight: 181 lb 6.4 oz (82.283 kg) IBW/kg (Calculated) : 82.2   Vital Signs: Temp: 97.4 F (36.3 C) (12/24 0529) Temp src: Oral (12/24 0529) BP: 143/70 mmHg (12/24 0529) Pulse Rate: 85 (12/24 0529)  Labs:  Recent Labs  11/28/13 1446 11/28/13 2300 11/29/13 0450 11/29/13 0945  HGB 10.8* 9.9* 10.2*  --   HCT 32.3* 30.5* 31.1*  --   PLT 185 192 191  --   CREATININE 1.08 1.05 1.13  --   TROPONINI <0.30 0.70* 0.46* <0.30    Estimated Creatinine Clearance: 54.6 ml/min (by C-G formula based on Cr of 1.13).   Medical History: Past Medical History  Diagnosis Date  . Stroke   . Dementia    Assessment: 79 year old male admitted with altered mental status who fell and was found down this morning. Dopplers performed show left leg is positive for a non-occlusive short segment of deep vein thrombosis in the distal common femoral vein. Patient was receiving sq heparin for dvt px will increase dose to full dose for VTE treatment. H/h low but stable. Will check baseline INR in anticipation of oral anticoagulation.  Goal of Therapy:  Anti-Xa level 0.6-1 units/ml 4hrs after LMWH dose given Monitor platelets by anticoagulation protocol: Yes   Plan:  Lovenox 80mg  sq q 12 hours CBC q 72 hours while on lovenox Follow plan for oral anticoagulation  Sheppard Coil PharmD., BCPS Clinical Pharmacist Pager [phone] 11/29/2013 12:45 PM

## 2013-11-29 NOTE — Progress Notes (Signed)
INITIAL NUTRITION ASSESSMENT  DOCUMENTATION CODES Per approved criteria  -Not Applicable   INTERVENTION: - Ensure Complete po TID, each supplement provides 350 kcal and 13 grams of protein  NUTRITION DIAGNOSIS: Inadequate oral intake related to altered mental status as evidenced by reported intake less than estimated needs.   Goal: Pt to meet >/= 90% of their estimated nutrition needs   Monitor:  Wt, po intake, acceptance of supplements, labs  Reason for Assessment: MST  79 y.o. male  Admitting Dx: <principal problem not specified>  ASSESSMENT: 79 y.o. male was brought to the ER the patient was found on the floor in his house. As per patient's son with whom I spoke over the phone patient has not been doing well for last 3 days and has been found to be having change in mental status has found the patient's grandsons 3 days ago.   Pt reported that he does not know if he has lost weight or not. He reports having a poor appetite. RD called down to kitchen to order pt's meal. Pt agreed to try Ensure Complete for extra calories and protein.  Height: Ht Readings from Last 1 Encounters:  11/28/13 6\' 2"  (1.88 m)    Weight: Wt Readings from Last 1 Encounters:  11/29/13 181 lb 6.4 oz (82.283 kg)    Ideal Body Weight: 82.2 kg  % Ideal Body Weight: 100%  Wt Readings from Last 10 Encounters:  11/29/13 181 lb 6.4 oz (82.283 kg)  02/03/11 203 lb (92.08 kg)    Usual Body Weight: unknown  % Usual Body Weight: unknown  BMI:  Body mass index is 23.28 kg/(m^2).  Estimated Nutritional Needs: Kcal: 2100-2400 Protein: 100-110 g Fluid: 2.1-2.4 L/day  Skin: WNL  Diet Order: Cardiac  EDUCATION NEEDS: -Education not appropriate at this time   Intake/Output Summary (Last 24 hours) at 11/29/13 1137 Last data filed at 11/29/13 0700  Gross per 24 hour  Intake    240 ml  Output    350 ml  Net   -110 ml    Last BM: none recorded   Labs:   Recent Labs Lab 11/28/13 1446  11/28/13 2300 11/29/13 0450  NA 143  --  144  K 3.0*  --  3.6  CL 105  --  107  CO2 25  --  26  BUN 37*  --  34*  CREATININE 1.08 1.05 1.13  CALCIUM 8.8  --  8.5  GLUCOSE 121*  --  94    CBG (last 3)  No results found for this basename: GLUCAP,  in the last 72 hours  Scheduled Meds: . enoxaparin (LOVENOX) injection  40 mg Subcutaneous Q24H  . potassium chloride  40 mEq Oral Once  . sodium chloride  3 mL Intravenous Q12H  . sodium chloride  3 mL Intravenous Q12H    Continuous Infusions:   Past Medical History  Diagnosis Date  . Stroke   . Dementia     Past Surgical History  Procedure Laterality Date  . Wrist surgery    . Prostate surgery      Ebbie Latus RD, LDN

## 2013-11-29 NOTE — Progress Notes (Signed)
Patient admitted to floor at this time.  Alert and oriented to person, place and situation.  Disoriented to time.  Patient states that he hasn't been to a doctor in over 20 years.  Also states he takes no medications at home.  Lives alone since his wife passed in March of 2014.  Has close friend, Gardiner Barefoot, that checks up on him and he is who brought patient to the hospital after he was found covered with feces and kneeling in his home.  Patient denies pain or discomfort.  Very pleasant.  Hard of hearing.  Wears glasses but not brought to hospital.  Will monitor for changes in condition.    William Wade

## 2013-11-29 NOTE — Consult Note (Signed)
Patient Name: William Wade Date of Encounter: 11/29/2013     Active Problems:   Acute encephalopathy   CHF (congestive heart failure)   Hypokalemia   Anemia   History of CVA (cerebrovascular accident)    SUBJECTIVE  William Wade is a 79 y.o. male with a history of CVA, HTN, chronic anemia and worsening dementia who presented to the ED yesterday with acute encephalopathy, acute on chronic heart failure, left DVT and hypokalemia. CT showed no acute changes. CXR with congestion concerning for CHF and patient appeared SOB. Patient admits to orthopnea but the rest of the history is difficult to obtain secondary to his dementia. His "friend," unclear whether he is a care taker.Enos Fling.Clearence Cheek? Reports that he found him on the floor yesterday after a fall. It is unclear how he fell or for how long. He states that Mr. William Wade was completely healthy, both cognitively and physically before this incident. He also reports that he believes Mr. William Wade would not want to have any type of surgery as his quality of life has been very poor after the recent passing of his wife.   CURRENT MEDS . enoxaparin (LOVENOX) injection  1 mg/kg Subcutaneous BID  . [START ON 11/30/2013] enoxaparin (LOVENOX) injection  1 mg/kg Subcutaneous Q12H  . feeding supplement (ENSURE COMPLETE)  237 mL Oral TID BM  . furosemide  40 mg Intravenous BID  . potassium chloride  40 mEq Oral BID  . sodium chloride  3 mL Intravenous Q12H  . sodium chloride  3 mL Intravenous Q12H    OBJECTIVE  Filed Vitals:   11/28/13 2045 11/28/13 2148 11/29/13 0529 11/29/13 1258  BP: 149/69 143/85 143/70 142/69  Pulse: 80 57 85 86  Temp:  98 F (36.7 C) 97.4 F (36.3 C) 97.5 F (36.4 C)  TempSrc:  Oral Oral Oral  Resp: 17 18 18 18   Height:  6\' 2"  (1.88 m)    Weight:  181 lb 6.4 oz (82.283 kg) 181 lb 6.4 oz (82.283 kg)   SpO2: 100%  92% 94%    Intake/Output Summary (Last 24 hours) at 11/29/13 1559 Last data filed at 11/29/13 1259  Gross per 24 hour  Intake    480 ml  Output    650 ml  Net   -170 ml   Filed Weights   11/28/13 2148 11/29/13 0529  Weight: 181 lb 6.4 oz (82.283 kg) 181 lb 6.4 oz (82.283 kg)    PHYSICAL EXAM  General: Pleasant, NAD, confused Neuro: Alert, but cannot oriented to people places or time Psych: Normal affect. HEENT:  Normal  Neck: Supple without bruits +JVD to jawline Lungs:  Resp regular and unlabored, CTA. Dull at bases Heart: RRR no s3, s4, 2/6 MR Abdomen: Soft, non-tender, non-distended, BS + x 4.  Extremities: No clubbing, cyanosis or 3+ pitting edema.L> R DP/PT/Radials 2+ and equal bilaterally.  Accessory Clinical Findings  CBC  Recent Labs  11/28/13 1446 11/28/13 2300 11/29/13 0450  WBC 11.3* 8.8 8.2  NEUTROABS 9.4*  --  5.3  HGB 10.8* 9.9* 10.2*  HCT 32.3* 30.5* 31.1*  MCV 97.6 97.8 98.4  PLT 185 192 191   Basic Metabolic Panel  Recent Labs  11/28/13 1446 11/28/13 2300 11/29/13 0450  NA 143  --  144  K 3.0*  --  3.6  CL 105  --  107  CO2 25  --  26  GLUCOSE 121*  --  94  BUN 37*  --  34*  CREATININE 1.08 1.05 1.13  CALCIUM 8.8  --  8.5   Liver Function Tests  Recent Labs  11/28/13 1446 11/29/13 0450  AST 32 27  ALT 24 21  ALKPHOS 65 59  BILITOT 1.2 1.2  PROT 6.1 5.8*  ALBUMIN 2.9* 2.7*    Cardiac Enzymes  Recent Labs  11/28/13 2300 11/29/13 0450 11/29/13 0945  TROPONINI 0.70* 0.46* <0.30    Thyroid Function Tests  Recent Labs  11/28/13 2300  TSH 1.137    ECG HR 87 Sinus rhythm Ventricular premature complex Inferior infarct, old Anteroseptal infarct, age indeterminate Prolonged QT interval   Radiology/Studies  Ct Head Wo Contrast  11/28/2013   CLINICAL DATA:  Altered mental status, history of stroke  EXAM: CT HEAD WITHOUT CONTRAST  TECHNIQUE: Contiguous axial images were obtained from the base of the skull through the vertex without intravenous contrast.  COMPARISON:  MRI brain dated 07/14/2006  FINDINGS: No  evidence of parenchymal hemorrhage or extra-axial fluid collection. No mass lesion, mass effect, or midline shift.  No CT evidence of acute infarction. Encephalomalacic changes related to prior left MCA distribution infarct.  Subcortical white matter and periventricular small vessel ischemic changes. Intracranial atherosclerosis.  Global cortical atrophy.  No ventriculomegaly.  The visualized paranasal sinuses are essentially clear. The mastoid air cells are unopacified.  No evidence of calvarial fracture.  IMPRESSION: No evidence of acute intracranial abnormality.  Encephalomalacic changes related to prior left MCA distribution infarct.  Atrophy with small vessel ischemic changes and intracranial atherosclerosis.   Mr Brain Wo Contrast  11/29/2013   CLINICAL DATA:  Altered mental status. Acute encephalopathy. Dementia. Evaluate for stroke.  EXAM: MRI HEAD WITHOUT CONTRAST  TECHNIQUE: Multiplanar, multiecho pulse sequences of the brain and surrounding structures were obtained without intravenous contrast.  COMPARISON:  CT head 11/28/2013.  MR head 07/14/2006.  FINDINGS: The patient was unable to remain motionless for the exam. Small or subtle lesions could be overlooked.  Chronic encephalomalacia related to old left MCA territory infarction affecting much of the left hemisphere including the frontal, temporal, and parietal lobes, including the basal ganglia. No acute stroke is evident. There is no acute hemorrhage, mass lesion, hydrocephalus, or extra-axial fluid. Generalized cerebral and cerebellar atrophy. Chronic microvascular ischemic change affects the periventricular and subcortical white matter. Prominent perivascular spaces suggest chronic hypertension.  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 Some flow void signal is seen in the left MCA trunk. This LICA vessel occlusion was  not present in 2007.  Pituitary, pineal, and cerebellar tonsils unremarkable. No upper cervical lesions. There may be slight chronic hemorrhage associated with the old left basal ganglia infarct.  No osseous lesions are evident. Upper cervical region unremarkable. Bilateral cataract extraction. No sinus or mastoid disease.  Compared with 2007, there has been progressive atrophy. The left MCA hemispheric infarctions were acute at that time. The left basal ganglia infarct was present before 2007.  IMPRESSION: No acute stroke or other acute intracranial findings.  Remote left MCA territory infarction.  Occluded left internal carotid artery has occurred since 2007. There appears to be collateral flow into the left anterior circulation based on flow void signal within the left MCA.      Dg Chest Port 1 View  11/28/2013   CLINICAL DATA:  Acute mental status changes.  EXAM: PORTABLE CHEST - 1 VIEW  COMPARISON:  None.  FINDINGS: The  lung bases are not included on this study. Visualized portion of the long bones demonstrate enlargement of the cardiac silhouette. There is prominence of the interstitial markings, indistinctness of the pulmonary vasculature, and peribronchial cuffing. The osseous structures demonstrate degenerative changes within the right lobe shoulders. No focal regions of consolidation appreciated.  IMPRESSION: Interstitial infiltrate likely reflecting pulmonary edema within the visualized portions of the chest.      ECHO Study Date: 11/29/2013 ------------------------------------------------------------ LV EF: 25% - 30% ------------------------------------------------------------ Indications: Dyspnea 786.09. ------------------------------------------------------------ History: PMH: No prior cardiac history. ------------------------------------------------------------ Study Conclusions - Left ventricle: The cavity size was mildly dilated. Wall thickness was normal. Systolic function was  severely reduced. The estimated ejection fraction was in the range of 25% to 30%. Diffuse hypokinesis. There is akinesis of the inferior posterior myocardium. Features are consistent with a pseudonormal left ventricular filling pattern, with concomitant abnormal relaxation and increased filling pressure (grade 2 diastolic dysfunction). - Mitral valve: Moderate to severe regurgitation. - Left atrium: The atrium was mildly dilated. - Right atrium: The atrium was mildly dilated. - Pulmonary arteries: Systolic pressure was mildly to moderately increased. PA peak pressure: 50mm Hg (S). Transthoracic echocardiography. M-mode, complete 2D, spectral Doppler, and color Doppler. Height: Height: 188cm. Height: 74in. Weight: Weight: 82.3kg. Weight: 181.1lb. Body mass index: BMI: 23.3kg/m^2. Body surface area: BSA: 2.63m^2. Blood pressure: 143/70. Patient status: Inpatient. Location: Echo laboratory. ------------------------------------------------------------ ------------------------------------------------------------ Left ventricle: The cavity size was mildly dilated. Wall thickness was normal. Systolic function was severely reduced. The estimated ejection fraction was in the range of 25% to 30%. Diffuse hypokinesis. Regional wall motion abnormalities: There is akinesis of the inferior posterior myocardium. Features are consistent with a pseudonormal left ventricular filling pattern, with concomitant abnormal relaxation and increased filling pressure (grade 2 diastolic dysfunction). ------------------------------------------------------------ Aortic valve: Trileaflet; mildly thickened leaflets. Mobility was not restricted. Doppler: Transvalvular velocity was within the normal range. There was no stenosis. No regurgitation. ------------------------------------------------------------ Aorta: Aortic root: The aortic root was normal in  size. ------------------------------------------------------------ Mitral valve: Structurally normal valve. Mobility was not restricted. Doppler: Transvalvular velocity was within the normal range. There was no evidence for stenosis. Moderate to severe regurgitation. Peak gradient: 4mm Hg (D). ------------------------------------------------------------ Left atrium: The atrium was mildly dilated. ----------------------------------------------------------- Right ventricle: The cavity size was normal. Systolic function was normal. ------------------------------------------------------------ Pulmonic valve: Doppler: Transvalvular velocity was within the normal range. There was no evidence for stenosis. Mild regurgitation. ----------------------------------------------------------- Tricuspid valve: Structurally normal valve. Doppler: Transvalvular velocity was within the normal range. Mild regurgitation. ------------------------------------------------------------ Pulmonary artery: Systolic pressure was mildly to moderately increased. ------------------------------------------------------------ Right atrium: The atrium was mildly dilated. ------------------------------------------------------------ Pericardium: There was no pericardial effusion. nm,---------------------------------------------------------- Systemic veins: Inferior vena cava: The vessel was mildly dilated.   ASSESSMENT AND PLAN  Bertel Venard is a 79 y.o. male with a history of CVA, HTN, chronic anemia and worsening dementia who presented to the ED yesterday with acute encephalopathy, acute on chronic heart failure, left DVT and hypokalemia.   Acute on chronic systolic heart failure  ECHO on 11/29/13 revealed EF 25-30%, abnormal relaxation and increased filling pressure (grade 2 diastolic dysfunction). -- Cr. 1.13, continue to monitor renal function  -- Increase to 40 mg IV Lasix BID, will increase Kdur 40mg   BID  Elevated troponin- patient denies chest pain, likely secondary to acute heart failure exacerbation -- cardiac cath could be an option if his mental status improves -- Troponin normal currently, continue to monitor   Left leg DVT -- Lower extremity swelling, with doppler confirmation  of DVT in left leg, on Lovenox  Anemia -- Chronic, internal medicine following   Hypokalemia- 3 on admission, given 40 mEq and has normalized -- Will increase to 40 BID given increase in Lasix dose   William Wade  Pager [phone]  Patient seen and examined with William Payment, PA-C. We discussed all aspects of the encounter. I agree with the assessment and plan as stated above.   79 y/o male wth h/o HTN, CVA and ? Dementia. Admitted with 3 days of progressive weakness and confusion. Found to have acute systolic HF with EF 25-30% and left DVT. Situation c/b depression due to patient losing his wife earlier in the year. Lives at home alone but has a friend who comes and checks on him regularly. Has significant volume overload on exam. Will increase IV lasix and titrate HF meds as tolerated (no b-blocker yet due to acute decompensation). According to patient and his caretaker, he is not interested in invasive work-up. We can discuss further with his son tomorrow when he arrives.   Truman Hayward 6:23 PM

## 2013-11-29 NOTE — Progress Notes (Signed)
VASCULAR LAB PRELIMINARY  PRELIMINARY  PRELIMINARY  PRELIMINARY  Bilateral lower extremity venous duplex completed    Preliminary report: Right leg negatvive for deep and superficial vein thrombosis.  The left leg is positive for a non-occlusive short segment of deep vein thrombosis in the distal common femoral vein.  No other deep or superficial vein thrombosis was noted in the left leg.  Thurlow Gallaga, RVT 11/29/2013, 10:39 AM

## 2013-11-29 NOTE — Progress Notes (Signed)
PT Cancellation Note  Patient Details Name: William Wade MRN: [id number] DOB: 22-Sep-1925   Cancelled Treatment:    Reason Eval/Treat Not Completed: Medical issues which prohibited therapy.  PT with (+) DVT.  MD recommending holding evaluation today.  PT to evaluate on Friday 12/26.     Rollene Rotunda Harue Pribble, PT, DPT [phone]   11/29/2013, 12:54 PM

## 2013-11-29 NOTE — Progress Notes (Signed)
Echo Lab  2D Echocardiogram completed.  Ailish Prospero L Sonya Pucci, RDCS 11/29/2013 11:26 AM

## 2013-11-30 DIAGNOSIS — I5021 Acute systolic (congestive) heart failure: Principal | ICD-10-CM

## 2013-11-30 LAB — BASIC METABOLIC PANEL
BUN: 37 mg/dL — ABNORMAL HIGH (ref 6–23)
CO2: 26 mEq/L (ref 19–32)
Calcium: 8.6 mg/dL (ref 8.4–10.5)
Chloride: 105 mEq/L (ref 96–112)
Creatinine, Ser: 1.14 mg/dL (ref 0.50–1.35)
GFR calc non Af Amer: 56 mL/min — ABNORMAL LOW (ref >90)
Sodium: 143 mEq/L (ref 135–145)

## 2013-11-30 LAB — PROTIME-INR: INR: 1.44 (ref 0.00–1.49)

## 2013-11-30 LAB — CBC
Hemoglobin: 11.2 g/dL — ABNORMAL LOW (ref 13.0–17.0)
MCHC: 32.5 g/dL (ref 30.0–36.0)
Platelets: 193 10*3/uL (ref 150–400)
RDW: 16.4 % — ABNORMAL HIGH (ref 11.5–15.5)
WBC: 13.1 10*3/uL — ABNORMAL HIGH (ref 4.0–10.5)

## 2013-11-30 MED ORDER — ENALAPRIL MALEATE 5 MG PO TABS
5.0000 mg | ORAL_TABLET | Freq: Every day | ORAL | Status: DC
  Administered 2013-12-01 – 2013-12-04 (×4): 5 mg via ORAL
  Filled 2013-11-30 (×4): qty 1

## 2013-11-30 MED ORDER — SPIRONOLACTONE 25 MG PO TABS
25.0000 mg | ORAL_TABLET | Freq: Every day | ORAL | Status: DC
  Administered 2013-11-30 – 2013-12-04 (×5): 25 mg via ORAL
  Filled 2013-11-30 (×5): qty 1

## 2013-11-30 MED ORDER — FUROSEMIDE 10 MG/ML IJ SOLN
80.0000 mg | Freq: Two times a day (BID) | INTRAMUSCULAR | Status: DC
  Administered 2013-11-30 – 2013-12-04 (×8): 80 mg via INTRAVENOUS
  Filled 2013-11-30 (×10): qty 8

## 2013-11-30 MED ORDER — POTASSIUM CHLORIDE CRYS ER 20 MEQ PO TBCR
40.0000 meq | EXTENDED_RELEASE_TABLET | Freq: Once | ORAL | Status: AC
  Administered 2013-11-30: 40 meq via ORAL
  Filled 2013-11-30: qty 2

## 2013-11-30 NOTE — Progress Notes (Addendum)
Advanced Heart Failure Rounding Note   Subjective:     [name] is a 79 y.o. male with a history of CVA, HTN, chronic anemia and ?dementia who was admitted with weakness, left DVT and new onset CHF. LVEF found to be 25-30%. He is not interested in an invasive work-up.   Lasix increased yesterday but I/Os only - 410. Weight up 3 pounds (weighed in bed). Renal function stable.   Denies dyspnea, orthopnea, PND. Remains weak. Unable to get up.   Objective:   Weight Range:  Vital Signs:   Temp:  [97.3 F (36.3 C)-98.2 F (36.8 C)] 97.3 F (36.3 C) (12/25 0427) Pulse Rate:  [86-110] 97 (12/25 0427) Resp:  [18-20] 18 (12/25 0427) BP: (141-152)/(69-97) 141/97 mmHg (12/25 0427) SpO2:  [92 %-96 %] 96 % (12/25 0427) Weight:  [82.28 kg (181 lb 6.3 oz)-83.553 kg (184 lb 3.2 oz)] 83.553 kg (184 lb 3.2 oz) (12/25 0427) Last BM Date: 11/29/13  Weight change: Filed Weights   11/29/13 1541 11/30/13 0028 11/30/13 0427  Weight: 82.28 kg (181 lb 6.3 oz) 83.553 kg (184 lb 3.2 oz) 83.553 kg (184 lb 3.2 oz)    Intake/Output:   Intake/Output Summary (Last 24 hours) at 11/30/13 1136 Last data filed at 11/30/13 0428  Gross per 24 hour  Intake    240 ml  Output    650 ml  Net   -410 ml     Physical Exam: General: Weak chronically ill appearing Neuro: Alert, answers questions knows he is in hospital Psych: Normal affect.  HEENT: Normal  Neck: Supple without bruits +JVD 9 Lungs: Resp regular and unlabored, CTA. Dull at bases  Heart: RRR no s3, s4, 2/6 MR  Abdomen: Soft, non-tender, non-distended, BS + x 4.  Extremities: No clubbing, cyanosis or 2+ pitting edema.L> R DP/PT/Radials 2+ and equal bilaterally.  Telemetry: Sinus  Labs: Basic Metabolic Panel:  Recent Labs Lab 11/28/13 1446 11/28/13 2300 11/29/13 0450 11/30/13 0526  NA 143  --  144 143  K 3.0*  --  3.6 4.3  CL 105  --  107 105  CO2 25  --  26 26  GLUCOSE 121*  --  94 202*  BUN 37*  --  34* 37*  CREATININE 1.08 1.05  1.13 1.14  CALCIUM 8.8  --  8.5 8.6    Liver Function Tests:  Recent Labs Lab 11/28/13 1446 11/29/13 0450  AST 32 27  ALT 24 21  ALKPHOS 65 59  BILITOT 1.2 1.2  PROT 6.1 5.8*  ALBUMIN 2.9* 2.7*   No results found for this basename: LIPASE, AMYLASE,  in the last 168 hours  Recent Labs Lab 11/28/13 2300  AMMONIA 10*    CBC:  Recent Labs Lab 11/28/13 1446 11/28/13 2300 11/29/13 0450 11/30/13 0526  WBC 11.3* 8.8 8.2 13.1*  NEUTROABS 9.4*  --  5.3  --   HGB 10.8* 9.9* 10.2* 11.2*  HCT 32.3* 30.5* 31.1* 34.5*  MCV 97.6 97.8 98.4 98.9  PLT 185 192 191 193    Cardiac Enzymes:  Recent Labs Lab 11/28/13 1446 11/28/13 2300 11/29/13 0450 11/29/13 0945  TROPONINI <0.30 0.70* 0.46* <0.30    BNP: BNP (last 3 results)  Recent Labs  11/28/13 1446  PROBNP 12187.0*     Other results:  EKG:   Imaging: Ct Head Wo Contrast  11/28/2013   CLINICAL DATA:  Altered mental status, history of stroke  EXAM: CT HEAD WITHOUT CONTRAST  TECHNIQUE: Contiguous axial images were obtained  from the base of the skull through the vertex without intravenous contrast.  COMPARISON:  MRI brain dated 07/14/2006  FINDINGS: No evidence of parenchymal hemorrhage or extra-axial fluid collection. No mass lesion, mass effect, or midline shift.  No CT evidence of acute infarction. Encephalomalacic changes related to prior left MCA distribution infarct.  Subcortical white matter and periventricular small vessel ischemic changes. Intracranial atherosclerosis.  Global cortical atrophy.  No ventriculomegaly.  The visualized paranasal sinuses are essentially clear. The mastoid air cells are unopacified.  No evidence of calvarial fracture.  IMPRESSION: No evidence of acute intracranial abnormality.  Encephalomalacic changes related to prior left MCA distribution infarct.  Atrophy with small vessel ischemic changes and intracranial atherosclerosis.   Electronically Signed   By: Charline Bills M.D.   On:  11/28/2013 19:26   Mr Brain Wo Contrast  11/29/2013   CLINICAL DATA:  Altered mental status. Acute encephalopathy. Dementia. Evaluate for stroke.  EXAM: MRI HEAD WITHOUT CONTRAST  TECHNIQUE: Multiplanar, multiecho pulse sequences of the brain and surrounding structures were obtained without intravenous contrast.  COMPARISON:  CT head 11/28/2013.  MR head 07/14/2006.  FINDINGS: The patient was unable to remain motionless for the exam. Small or subtle lesions could be overlooked.  Chronic encephalomalacia related to old left MCA territory infarction affecting much of the left hemisphere including the frontal, temporal, and parietal lobes, including the basal ganglia. No acute stroke is evident. There is no acute hemorrhage, mass lesion, hydrocephalus, or extra-axial fluid. Generalized cerebral and cerebellar atrophy. Chronic microvascular ischemic change affects the periventricular and subcortical white matter. Prominent perivascular spaces suggest chronic hypertension.  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 Some flow void signal is seen in the left MCA trunk. This LICA vessel occlusion was not present in 2007.  Pituitary, pineal, and cerebellar tonsils unremarkable. No upper cervical lesions. There may be slight chronic hemorrhage associated with the old left basal ganglia infarct.  No osseous lesions are evident. Upper cervical region unremarkable. Bilateral cataract extraction. No sinus or mastoid disease.  Compared with 2007, there has been progressive atrophy. The left MCA hemispheric infarctions were acute at that time. The left basal ganglia infarct was present before 2007.  IMPRESSION: No acute stroke or other acute intracranial findings.  Remote left MCA territory infarction.  Occluded left internal carotid artery has occurred since 2007. There appears to be collateral flow  into the left anterior circulation based on flow void signal within the left MCA.   Electronically Signed   By: Davonna Belling M.D.   On: 11/29/2013 09:53   Dg Chest Port 1 View  11/28/2013   CLINICAL DATA:  Acute mental status changes.  EXAM: PORTABLE CHEST - 1 VIEW  COMPARISON:  None.  FINDINGS: The lung bases are not included on this study. Visualized portion of the long bones demonstrate enlargement of the cardiac silhouette. There is prominence of the interstitial markings, indistinctness of the pulmonary vasculature, and peribronchial cuffing. The osseous structures demonstrate degenerative changes within the right lobe shoulders. No focal regions of consolidation appreciated.  IMPRESSION: Interstitial infiltrate likely reflecting pulmonary edema within the visualized portions of the chest.   Electronically Signed   By: Salome Holmes M.D.   On: 11/28/2013 15:40      Medications:     Scheduled Medications: . aspirin EC  81 mg Oral Daily  . enalapril  2.5 mg  Oral Daily  . enoxaparin (LOVENOX) injection  1 mg/kg Subcutaneous Q12H  . feeding supplement (ENSURE COMPLETE)  237 mL Oral TID BM  . furosemide  40 mg Intravenous BID  . potassium chloride  40 mEq Oral BID  . sodium chloride  3 mL Intravenous Q12H  . sodium chloride  3 mL Intravenous Q12H     Infusions:     PRN Medications:  acetaminophen, acetaminophen, ondansetron (ZOFRAN) IV, ondansetron   Assessment:  1. Acute systolic HF EF 25-30% 2. Acute delirium 3. LLE DVT 4. Physical deconditioning 5. Recent death of his wife in 04-04-146. Hypokalemia 7. Protein-calorie malnutrition (Albumin 2.7)   Plan/Discussion:    Symptomatically waxing and waning.  Still with volume on board. Weight is up but not sure if bed weight is accurate. Will increase lasix to 80 iv bid. Place TED hose. Supp K+ and add spiro.   Will increase ACE-I gently. No b-blocker yet.   I worry that he is quite weak overall and may be  deteriorating. I am worried that he may be nearing the end of his life. If not improving quickly may be worth having Palliative Care meeting for GOC. Son to be here this afternoon at 3pm.  Not sure he will be good coumadin candidate unless he goes to SNF. Conside NOAC such as Xarelto or Eliquis depending on disposition.   Parthenia Tellefsen,MD 11:51 AM   Length of Stay: 2 Advanced Heart Failure Team Pager [phone] (M-F; 7a - 4p)  Please contact [facility] for night-coverage after hours (4p -7a ) and weekends on [email or web address]

## 2013-11-30 NOTE — Progress Notes (Signed)
SON IS POA, BUT INFORMATION CAN BE GIVEN TO PATIENTS FRIEND NATANAEL SOLOMAN.

## 2013-11-30 NOTE — Progress Notes (Signed)
Pt lungs noted to have crackles and rhonchi un upper lobes, with sob sats wnl. Page [facility]. Orders received. Monitoring will continue.

## 2013-11-30 NOTE — Progress Notes (Addendum)
[facility] PROGRESS NOTE  William Wade WUJ:[id number] DOB: Mar 21, 1925 DOA: 11/28/2013 PCP: Garlan Fillers, MD  Assessment/Plan: 1-Acute encephalopathy in a patient with dementia ; MRI negative for stroke. UA negative. Chest x ray; pulmonary edema. TSH normal at 1.1,  B-12 at 1040, ammonia at 10.   2-Acute Systolic Heart Failure;  Lasix change to  80 mg IV BID. Appreciate Dr Gala Romney assistance. Mildly increase troponin. ECHO with Ef 25 to 30 %.   3-Hypokalemia; resolved.  4-Anemia - as per patient's son patient has chronic anemia. Per the notes reviewed in the chart patient has previously refused a colonoscopy. Hb stable. Probably iron deficiency anemia.  5-Previous history of CVA - MRI negative for acute stroke.  6-left leg is positive for a non-occlusive short segment of deep vein thrombosis in the distal common femoral vein; Continue with Lovenox , pharmacy to dose. Will consider coumadin vs Xarelto, Eliquis. We might need to use coumadin due to history of anemia and no prior GI work up.     Code Status: DNR.  Family Communication: I spoke with son and grandson William Wade about patient medical condition. Son would like SNF placement. He agree to speak with palliative for goals of care while he is in town.  Disposition Plan: Remain in patient.    Consultants:  Cardiology  Procedures: ECHO: Left ventricle: The cavity size was mildly dilated. Wall thickness was normal. Systolic function was severely reduced. The estimated ejection fraction was in the range of 25% to 30%. Diffuse hypokinesis. There is akinesis of the inferior posterior myocardium. Features are consistent with a pseudonormal left ventricular filling pattern, with concomitant abnormal relaxation and increased filling pressure (grade 2 diastolic dysfunction). - Mitral valve: Moderate to severe regurgitation. - Left atrium: The atrium was mildly dilated. - Right atrium: The atrium was mildly dilated. - Pulmonary  arteries: Systolic pressure was mildly to moderately increased. PA peak pressure: 50mm Hg (S).    Antibiotics:  none  HPI/Subjective: Alert, still with SOB.   Objective: Filed Vitals:   11/30/13 1319  BP: 127/76  Pulse: 86  Temp: 98 F (36.7 C)  Resp: 18    Intake/Output Summary (Last 24 hours) at 11/30/13 1643 Last data filed at 11/30/13 1300  Gross per 24 hour  Intake    260 ml  Output    350 ml  Net    -90 ml   Filed Weights   11/29/13 1541 11/30/13 0028 11/30/13 0427  Weight: 82.28 kg (181 lb 6.3 oz) 83.553 kg (184 lb 3.2 oz) 83.553 kg (184 lb 3.2 oz)    Exam:   General:  Alert in no acute distress.   Cardiovascular: S 1, S 2 RRR  Respiratory: Crackles bilaterally.   Abdomen: BS present, soft, Nt  Musculoskeletal: bilateral LE edema.   Data Reviewed: Basic Metabolic Panel:  Recent Labs Lab 11/28/13 1446 11/28/13 2300 11/29/13 0450 11/30/13 0526  NA 143  --  144 143  K 3.0*  --  3.6 4.3  CL 105  --  107 105  CO2 25  --  26 26  GLUCOSE 121*  --  94 202*  BUN 37*  --  34* 37*  CREATININE 1.08 1.05 1.13 1.14  CALCIUM 8.8  --  8.5 8.6   Liver Function Tests:  Recent Labs Lab 11/28/13 1446 11/29/13 0450  AST 32 27  ALT 24 21  ALKPHOS 65 59  BILITOT 1.2 1.2  PROT 6.1 5.8*  ALBUMIN 2.9* 2.7*   No results found  for this basename: LIPASE, AMYLASE,  in the last 168 hours  Recent Labs Lab 11/28/13 2300  AMMONIA 10*   CBC:  Recent Labs Lab 11/28/13 1446 11/28/13 2300 11/29/13 0450 11/30/13 0526  WBC 11.3* 8.8 8.2 13.1*  NEUTROABS 9.4*  --  5.3  --   HGB 10.8* 9.9* 10.2* 11.2*  HCT 32.3* 30.5* 31.1* 34.5*  MCV 97.6 97.8 98.4 98.9  PLT 185 192 191 193   Cardiac Enzymes:  Recent Labs Lab 11/28/13 1446 11/28/13 2300 11/29/13 0450 11/29/13 0945  TROPONINI <0.30 0.70* 0.46* <0.30   BNP (last 3 results)  Recent Labs  11/28/13 1446  PROBNP 12187.0*   CBG: No results found for this basename: GLUCAP,  in the last 168  hours  No results found for this or any previous visit (from the past 240 hour(s)).   Studies: Ct Head Wo Contrast  11/28/2013   CLINICAL DATA:  Altered mental status, history of stroke  EXAM: CT HEAD WITHOUT CONTRAST  TECHNIQUE: Contiguous axial images were obtained from the base of the skull through the vertex without intravenous contrast.  COMPARISON:  MRI brain dated 07/14/2006  FINDINGS: No evidence of parenchymal hemorrhage or extra-axial fluid collection. No mass lesion, mass effect, or midline shift.  No CT evidence of acute infarction. Encephalomalacic changes related to prior left MCA distribution infarct.  Subcortical white matter and periventricular small vessel ischemic changes. Intracranial atherosclerosis.  Global cortical atrophy.  No ventriculomegaly.  The visualized paranasal sinuses are essentially clear. The mastoid air cells are unopacified.  No evidence of calvarial fracture.  IMPRESSION: No evidence of acute intracranial abnormality.  Encephalomalacic changes related to prior left MCA distribution infarct.  Atrophy with small vessel ischemic changes and intracranial atherosclerosis.   Electronically Signed   By: Charline Bills M.D.   On: 11/28/2013 19:26   Mr Brain Wo Contrast  11/29/2013   CLINICAL DATA:  Altered mental status. Acute encephalopathy. Dementia. Evaluate for stroke.  EXAM: MRI HEAD WITHOUT CONTRAST  TECHNIQUE: Multiplanar, multiecho pulse sequences of the brain and surrounding structures were obtained without intravenous contrast.  COMPARISON:  CT head 11/28/2013.  MR head 07/14/2006.  FINDINGS: The patient was unable to remain motionless for the exam. Small or subtle lesions could be overlooked.  Chronic encephalomalacia related to old left MCA territory infarction affecting much of the left hemisphere including the frontal, temporal, and parietal lobes, including the basal ganglia. No acute stroke is evident. There is no acute hemorrhage, mass lesion,  hydrocephalus, or extra-axial fluid. Generalized cerebral and cerebellar atrophy. Chronic microvascular ischemic change affects the periventricular and subcortical white matter. Prominent perivascular spaces suggest chronic hypertension.  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 Some flow void signal is seen in the left MCA trunk. This LICA vessel occlusion was not present in 2007.  Pituitary, pineal, and cerebellar tonsils unremarkable. No upper cervical lesions. There may be slight chronic hemorrhage associated with the old left basal ganglia infarct.  No osseous lesions are evident. Upper cervical region unremarkable. Bilateral cataract extraction. No sinus or mastoid disease.  Compared with 2007, there has been progressive atrophy. The left MCA hemispheric infarctions were acute at that time. The left basal ganglia infarct was present before 2007.  IMPRESSION: No acute stroke or other acute intracranial findings.  Remote left MCA territory infarction.  Occluded left internal carotid artery has occurred since 2007. There  appears to be collateral flow into the left anterior circulation based on flow void signal within the left MCA.   Electronically Signed   By: Davonna Belling M.D.   On: 11/29/2013 09:53    Scheduled Meds: . aspirin EC  81 mg Oral Daily  . [START ON 12/01/2013] enalapril  5 mg Oral Daily  . enoxaparin (LOVENOX) injection  1 mg/kg Subcutaneous Q12H  . feeding supplement (ENSURE COMPLETE)  237 mL Oral TID BM  . furosemide  80 mg Intravenous BID  . potassium chloride  40 mEq Oral BID  . potassium chloride  40 mEq Oral Once  . sodium chloride  3 mL Intravenous Q12H  . sodium chloride  3 mL Intravenous Q12H  . spironolactone  25 mg Oral Daily   Continuous Infusions:   Active Problems:   Acute encephalopathy   CHF (congestive heart failure)   Hypokalemia    Anemia   History of CVA (cerebrovascular accident)   Acute systolic heart failure    Time spent: 30 minutes.     Breckyn Ticas  [facility] Pager [phone]. If 7PM-7AM, please contact night-coverage at [email or web address], password [facility] 11/30/2013, 4:43 PM  LOS: 2 days

## 2013-11-30 NOTE — Progress Notes (Signed)
Thank you for consulting the Palliative Medicine Team at [facility] to meet your patient's and family's needs.   The reason that you asked [place] to see your patient is  For Goc  We have scheduled your patient for a meeting: 12/26 @ 830 am  The Surrogate decision make is:son  Contact information:see face sheet  Other family members that need to be present: none    Your patient is able/unable to participate:  Additional Narrative:    Demorris Choyce L. Ladona Ridgel, MD MBA The Palliative Medicine Team at [facility] Phone: [phone] Pager: [phone]

## 2013-12-01 ENCOUNTER — Inpatient Hospital Stay (HOSPITAL_COMMUNITY): Payer: Medicare Other

## 2013-12-01 LAB — BASIC METABOLIC PANEL
BUN: 35 mg/dL — ABNORMAL HIGH (ref 6–23)
CO2: 29 mEq/L (ref 19–32)
Calcium: 8.2 mg/dL — ABNORMAL LOW (ref 8.4–10.5)
Chloride: 105 mEq/L (ref 96–112)
Creatinine, Ser: 1.31 mg/dL (ref 0.50–1.35)
GFR calc Af Amer: 55 mL/min — ABNORMAL LOW (ref >90)
GFR calc non Af Amer: 48 mL/min — ABNORMAL LOW (ref >90)
Glucose, Bld: 120 mg/dL — ABNORMAL HIGH (ref 70–99)
Potassium: 4.5 mEq/L (ref 3.5–5.1)
Sodium: 143 mEq/L (ref 135–145)

## 2013-12-01 MED ORDER — WARFARIN - PHARMACIST DOSING INPATIENT: Freq: Every day | Status: DC

## 2013-12-01 MED ORDER — PATIENT'S GUIDE TO USING COUMADIN BOOK
Freq: Once | Status: AC
  Administered 2013-12-01: 18:00:00
  Filled 2013-12-01: qty 1

## 2013-12-01 MED ORDER — WARFARIN VIDEO
Freq: Once | Status: AC
  Administered 2013-12-02: 12:00:00

## 2013-12-01 MED ORDER — LEVOFLOXACIN 500 MG PO TABS
500.0000 mg | ORAL_TABLET | Freq: Every day | ORAL | Status: DC
  Administered 2013-12-01: 500 mg via ORAL
  Filled 2013-12-01 (×2): qty 1

## 2013-12-01 MED ORDER — WARFARIN SODIUM 2.5 MG PO TABS
2.5000 mg | ORAL_TABLET | Freq: Once | ORAL | Status: AC
  Administered 2013-12-01: 2.5 mg via ORAL
  Filled 2013-12-01: qty 1

## 2013-12-01 NOTE — Progress Notes (Addendum)
ANTICOAGULATION CONSULT NOTE   Pharmacy Consult for lovenox>>warfarin Indication: DVT  No Known Allergies  Patient Measurements: Height: 6\' 2"  (188 cm) Weight: 179 lb 12.8 oz (81.557 kg) IBW/kg (Calculated) : 82.2   Vital Signs: Temp: 98.2 F (36.8 C) (12/26 0549) Temp src: Oral (12/26 0549) BP: 125/71 mmHg (12/26 1051) Pulse Rate: 68 (12/26 1006)  Labs:  Recent Labs  11/28/13 2300 11/29/13 0450 11/29/13 0945 11/30/13 0526 12/01/13 0530  HGB 9.9* 10.2*  --  11.2*  --   HCT 30.5* 31.1*  --  34.5*  --   PLT 192 191  --  193  --   LABPROT  --   --   --  17.2*  --   INR  --   --   --  1.44  --   CREATININE 1.05 1.13  --  1.14 1.31  TROPONINI 0.70* 0.46* <0.30  --   --     Estimated Creatinine Clearance: 46.7 ml/min (by C-G formula based on Cr of 1.31).   Medical History: Past Medical History  Diagnosis Date  . Stroke   . Dementia   . HTN (hypertension)    Assessment: 79 year old male admitted with altered mental status who fell and was found down. Dopplers performed show left leg is positive for a non-occlusive short segment of deep vein thrombosis in the distal common femoral vein. CBC has been stable and no bleeding has been noted. Scr rising but bid lovenox dosing still appropriate. Baseline INR 1.44  Family meeting today. Orders received to start warfarin. Given his elevated baseline INR, age, low weight will start with low dose. Today will be day 1/5 of vte overlap.  Goal of Therapy:  Anti-Xa level 0.6-1 units/ml 4hrs after LMWH dose given Monitor platelets by anticoagulation protocol: Yes INR goal 2-3   Plan:  Lovenox 80mg  sq q 12 hours CBC q 72 hours while on lovenox - next 12/28 Warfarin 2.5mg  tonight  Sheppard Coil PharmD., BCPS Clinical Pharmacist Pager [phone] 12/01/2013 11:03 AM

## 2013-12-01 NOTE — Progress Notes (Signed)
Patient William Wade      DOB: 17-Jul-1925      EAV:[id number]  Patient awake for me to visit with after being with son this am.  Patient does not know his medical diagnosis and when told about them in plan language has no ability to understand what impact they would have on his life .  He has no insight or judgement regarding his current poor health and thinks "he can just go home and do nothing and be ok".  He is oriented to his son and general time. He is able to tell me that Christmas was the most recent holiday.  He, however, is not able to understand the extent of his illness or the consequences of not treating his illness.  His son reports that he sits at home going through old bills stating he is " working."  If there is concern regarding capacity team should consider psych eval.  At this time, I do not believe the patient has capacity for decision making which is likely due to his short term memory deficits.   Patient with copious upper airway secretions despite improving chest xray . May need gentle NT suction.  Would ask for speech eval.  Will let Dr. Sunnie Nielsen know  My thoughts.  William Wade L. Ladona Ridgel, MD MBA The Palliative Medicine Team at [facility] Phone: [phone] Pager: [phone]

## 2013-12-01 NOTE — Progress Notes (Signed)
Utilization review completed.  

## 2013-12-01 NOTE — Clinical Social Work Psychosocial (Signed)
Clinical Social Work Department BRIEF PSYCHOSOCIAL ASSESSMENT 12/01/2013  Patient:  William Wade, AGE     Account Number:  [id number]     Admit date:  11/28/2013  Clinical Social Worker:  Cristy Folks  Date/Time:  12/01/2013 02:21 PM  Referred by:  Physician  Date Referred:  12/01/2013 Referred for  SNF Placement   Other Referral:   Interview type:  Family Other interview type:    PSYCHOSOCIAL DATA Living Status:  ALONE Admitted from facility:   Level of care:   Primary support name:  William Wade Primary support relationship to patient:  CHILD, ADULT Degree of support available:   Adequate support    CURRENT CONCERNS Current Concerns  Post-Acute Placement  Knowledge/Cognitive Deficit  Adjustment to Illness   Other Concerns:    SOCIAL WORK ASSESSMENT / PLAN CSW met with pt.'s son, William Wade. who currently lives in [place] [phone]. [name] was a good historian who was able to share with CSW that pt.was married for 61 years and his wife passed away in 02-28-2013. [name] reports that pt. has lived at home for the most part but has noticed a medical decline. [name] reports his father lives alone and he feels that his father is not able to take care of himself and would like to pursue placement at skilled facility. [name] was given a list of SNF in the [place] area. [name]  asked questions in regards to cost of services at [facility]. CSW shared the cost will vary depending upon insurance coverage and services requested thru facility and to ask theses questions  of the facility. Pt. does not want to be admitted into a facility, may need an evaluation to determine decision making capacity.   Assessment/plan status:  Psychosocial Support/Ongoing Assessment of Needs Other assessment/ plan:   Information/referral to community resources:    PATIENT'S/FAMILY'S RESPONSE TO PLAN OF CARE: [name] understands the role of CSW. Pt.wanted to make sure the plan of care  was moving forward. [name] will call SNF from list given and make site visits.   [address] [place] [phone]

## 2013-12-01 NOTE — Evaluation (Signed)
Physical Therapy Evaluation Patient Details Name: William Wade MRN: [id number] DOB: [birth year] Today's Date: 12/01/2013 Time: 2952-8413 PT Time Calculation (min): 23 min  PT Assessment / Plan / Recommendation History of Present Illness  79 y.o. male admitted to [facility] on 11/28/13 with confusion and weakness.  He was found on the floor at home.  Pt was found to have a (+) DVT in the left distal common femoral vein.  Cardiology also consulted and pt found to be in heart failure and not deemed to be a candidate for a heart cath and it is being managed medically.  The pt has pulmonary edema and bil leg swelling.    Clinical Impression  Pt presents with generalized weakness and poor cardiorespiratory endurance.  He needs constant hands on assist to move about the room on 4 L O2 [place] and with RW  (PTA used a cane and no O2).  Pt is no longer able to live safely alone.  He would benefit from SNF placement for rehab with transition if able to ALF level of care.   PT to follow acutely for deficits listed below.       PT Assessment  Patient needs continued PT services    Follow Up Recommendations  SNF    Does the patient have the potential to tolerate intense rehabilitation     NA  Barriers to Discharge  None      Equipment Recommendations  Rolling walker with 5" wheels    Recommendations for Other Services   None  Frequency Min 2X/week    Precautions / Restrictions Precautions Precautions: Fall   Pertinent Vitals/Pain O2 sats after walking on 4 L O2 [place] 96%, HR 68 bpm, DOE 2-3/4 at rest and with gait.        Mobility  Bed Mobility Bed Mobility: Not assessed (pt is seated on the EOB already.  ) Transfers Transfers: Sit to Stand;Stand to Sit;Stand Pivot Transfers Sit to Stand: 4: Min assist;From elevated surface;With upper extremity assist;With armrests;From bed Stand to Sit: 4: Min assist;With upper extremity assist;With armrests;To bed;To chair/3-in-1 Stand Pivot Transfers: 3: Mod  assist;From elevated surface;With armrests Details for Transfer Assistance: min assist to get to standing from tall bed.  Mod assist to transfer bed to chair without RW.  Max verbal cues for safety with RW and safe hand placement during transfers.  Ambulation/Gait Ambulation/Gait Assistance: 4: Min assist Ambulation Distance (Feet): 15 Feet Assistive device: Rolling walker Ambulation/Gait Assistance Details: Min assist to steady pt for balance and help him steer his RW during gait.   Gait Pattern: Step-through pattern;Shuffle;Trunk flexed Gait velocity: decreased General Gait Details: 4 L O2  on during gait, DOE 2-3/4 throughout session, even at rest.  O2 sats and HR stable throughout.      Exercises General Exercises - Lower Extremity Long Arc Quad: AROM;Both;5 reps;Seated Hip Flexion/Marching: AROM;Both;5 reps;Seated;AAROM (AAROM R leg)   PT Diagnosis: Difficulty walking;Abnormality of gait;Generalized weakness;Altered mental status  PT Problem List: Decreased strength;Decreased activity tolerance;Decreased balance;Decreased mobility;Decreased cognition;Decreased knowledge of use of DME;Cardiopulmonary status limiting activity PT Treatment Interventions: DME instruction;Gait training;Functional mobility training;Therapeutic activities;Therapeutic exercise;Balance training;Neuromuscular re-education;Cognitive remediation;Patient/family education     PT Goals(Current goals can be found in the care plan section) Acute Rehab PT Goals Patient Stated Goal: pt wants to go home PT Goal Formulation: With patient/family Time For Goal Achievement: 12/15/13 Potential to Achieve Goals: Fair  Visit Information  Last PT Received On: 12/01/13 Assistance Needed: +1 History of Present Illness: 79 y.o. male admitted  to [facility] on 11/28/13 with confusion and weakness.  He was found on the floor at home.  Pt was found to have a (+) DVT in the left distal common femoral vein.  Cardiology also consulted and  pt found to be in heart failure and not deemed to be a candidate for a heart cath and it is being managed medically.  The pt has pulmonary edema and bil leg swelling.         Prior Functioning  Home Living Family/patient expects to be discharged to:: Skilled nursing facility Additional Comments: PTA pt lived alone, had a friend, Reuel Boom who would check on him. He ambulated with a cane and still drove.  He did not use O2 at home.  Prior Function Level of Independence: Independent with assistive device(s) Comments: Pt did not really cook at all, relied on friends and going out to eat for meals.  Wife died ~ 1 year ago.   Communication Communication: HOH    Cognition  Cognition Arousal/Alertness: Awake/alert Behavior During Therapy: WFL for tasks assessed/performed Overall Cognitive Status: Impaired/Different from baseline Area of Impairment: Memory;Orientation Orientation Level: Time;Situation Memory: Decreased short-term memory    Extremity/Trunk Assessment Upper Extremity Assessment Upper Extremity Assessment: Generalized weakness Lower Extremity Assessment Lower Extremity Assessment: RLE deficits/detail;LLE deficits/detail RLE Deficits / Details: bil legs generally weak.  Right weaker than left (3-/5 R, 3/5 left).  Per son, right leg was the one that was already weaker from PMhx of stroke, right leg is also more edemous.   LLE Deficits / Details: bil legs generally weak.  Right weaker than left (3-/5 R, 3/5 left).  Per son, right leg was the one that was already weaker from PMhx of stroke, right leg is also more edemous.   Cervical / Trunk Assessment Cervical / Trunk Assessment: Normal   Balance Balance Balance Assessed: Yes Static Sitting Balance Static Sitting - Balance Support: Bilateral upper extremity supported;Feet supported Static Sitting - Level of Assistance: 5: Stand by assistance Static Sitting - Comment/# of Minutes: Very flexed sitting posture EOB. due to generalized  weakness in trunk.  Static Standing Balance Static Standing - Balance Support: Bilateral upper extremity supported Static Standing - Level of Assistance: 4: Min assist Static Standing - Comment/# of Minutes: with RW Dynamic Standing Balance Dynamic Standing - Balance Support: Bilateral upper extremity supported Dynamic Standing - Level of Assistance: 4: Min assist Dynamic Standing - Comments: with RW  End of Session PT - End of Session Equipment Utilized During Treatment: Gait belt;Oxygen Activity Tolerance: Patient limited by fatigue Patient left: in chair;with call bell/phone within reach;with chair alarm set;with family/visitor present       Lurena Joiner B. Gage Weant, PT, DPT [phone]   12/01/2013, 10:18 AM

## 2013-12-01 NOTE — Clinical Social Work Placement (Addendum)
Clinical Social Work Department CLINICAL SOCIAL WORK PLACEMENT NOTE 12/01/2013  Patient:  William Wade, William Wade  Account Number:  [id number] Admit date:  11/28/2013  Clinical Social Worker:  Cherre Blanc, [place]  Date/time:  12/01/2013 03:30 PM  Clinical Social Work is seeking post-discharge placement for this patient at the following level of care:   SKILLED NURSING   (*CSW will update this form in Epic as items are completed)   12/01/2013  Patient/family provided with [facility] Department of Clinical Social Work's list of facilities offering this level of care within the geographic area requested by the patient (or if unable, by the patient's family).  12/01/2013  Patient/family informed of their freedom to choose among providers that offer the needed level of care, that participate in Medicare, Medicaid or managed care program needed by the patient, have an available bed and are willing to accept the patient.  12/01/2013  Patient/family informed of [facility]' ownership interest in [facility], as well as of the fact that they are under no obligation to receive care at this facility.  PASARR submitted to EDS on 12/01/2013 PASARR number received from EDS on 12/01/2013  FL2 transmitted to all facilities in geographic area requested by pt/family on  12/01/2013 FL2 transmitted to all facilities within larger geographic area on   Patient informed that his/her managed care company has contracts with or will negotiate with  certain facilities, including the following:     Patient/family informed of bed offers received:  12/02/2013 Patient chooses bed at [facility] Physician recommends and patient chooses bed at    Patient to be transferred to  [facility] on  12/04/2013 Patient to be transferred to facility by [facility]  The following physician request were entered in Epic:   Additional Comments:   Roddie Mc, Bryon Lions, [phone]

## 2013-12-01 NOTE — Progress Notes (Addendum)
Advanced Heart Failure Rounding Note   Subjective:     [name] is a 79 y.o. male with a history of CVA, HTN, chronic anemia and ?dementia who was admitted with weakness, left DVT and new onset CHF. LVEF found to be 25-30%. He is not interested in an invasive work-up.   Looks brighter but still frail. Getting out of bed with PT this am and using walker to get across room. Denies dyspnea or PND.  Lasix increased yesterday to 80 iv bid. I/Os seem inaccurate. Weight down 5 pounds  Cr 1.3 up from 1.1  Hospice meeting conducted this am.   Objective:   Weight Range:  Vital Signs:   Temp:  [97.4 F (36.3 C)-98.2 F (36.8 C)] 98.2 F (36.8 C) (12/26 0549) Pulse Rate:  [68-86] 68 (12/26 1006) Resp:  [16-18] 16 (12/26 0549) BP: (118-127)/(57-76) 125/71 mmHg (12/26 1051) SpO2:  [95 %-100 %] 96 % (12/26 1006) Weight:  [81.557 kg (179 lb 12.8 oz)] 81.557 kg (179 lb 12.8 oz) (12/26 0627) Last BM Date: 11/29/13  Weight change: Filed Weights   11/30/13 0028 11/30/13 0427 12/01/13 0627  Weight: 83.553 kg (184 lb 3.2 oz) 83.553 kg (184 lb 3.2 oz) 81.557 kg (179 lb 12.8 oz)    Intake/Output:   Intake/Output Summary (Last 24 hours) at 12/01/13 1114 Last data filed at 12/01/13 0500  Gross per 24 hour  Intake    410 ml  Output    775 ml  Net   -365 ml     Physical Exam: General: Weak chronically ill appearing Neuro: Alert, answers questions knows he is in hospital Psych: Normal affect.  HEENT: Normal  Neck: Supple without bruits +JVD 9 Lungs: Resp regular and unlabored, CTA. Dull at bases  Heart: RRR  s3, 2/6 MR  Abdomen: Soft, non-tender, non-distended, BS + x 4.  Extremities: No clubbing, cyanosis or 3+ pitting edema.R> LDP/PT/Radials 2+ and equal bilaterally.  Telemetry: Sinus  Labs: Basic Metabolic Panel:  Recent Labs Lab 11/28/13 1446 11/28/13 2300 11/29/13 0450 11/30/13 0526 12/01/13 0530  NA 143  --  144 143 143  K 3.0*  --  3.6 4.3 4.5  CL 105  --  107 105 105   CO2 25  --  26 26 29   GLUCOSE 121*  --  94 202* 120*  BUN 37*  --  34* 37* 35*  CREATININE 1.08 1.05 1.13 1.14 1.31  CALCIUM 8.8  --  8.5 8.6 8.2*    Liver Function Tests:  Recent Labs Lab 11/28/13 1446 11/29/13 0450  AST 32 27  ALT 24 21  ALKPHOS 65 59  BILITOT 1.2 1.2  PROT 6.1 5.8*  ALBUMIN 2.9* 2.7*   No results found for this basename: LIPASE, AMYLASE,  in the last 168 hours  Recent Labs Lab 11/28/13 2300  AMMONIA 10*    CBC:  Recent Labs Lab 11/28/13 1446 11/28/13 2300 11/29/13 0450 11/30/13 0526  WBC 11.3* 8.8 8.2 13.1*  NEUTROABS 9.4*  --  5.3  --   HGB 10.8* 9.9* 10.2* 11.2*  HCT 32.3* 30.5* 31.1* 34.5*  MCV 97.6 97.8 98.4 98.9  PLT 185 192 191 193    Cardiac Enzymes:  Recent Labs Lab 11/28/13 1446 11/28/13 2300 11/29/13 0450 11/29/13 0945  TROPONINI <0.30 0.70* 0.46* <0.30    BNP: BNP (last 3 results)  Recent Labs  11/28/13 1446  PROBNP 12187.0*     Imaging: No results found.   Medications:     Scheduled Medications: .  aspirin EC  81 mg Oral Daily  . enalapril  5 mg Oral Daily  . enoxaparin (LOVENOX) injection  1 mg/kg Subcutaneous Q12H  . feeding supplement (ENSURE COMPLETE)  237 mL Oral TID BM  . furosemide  80 mg Intravenous BID  . potassium chloride  40 mEq Oral BID  . sodium chloride  3 mL Intravenous Q12H  . sodium chloride  3 mL Intravenous Q12H  . spironolactone  25 mg Oral Daily    Infusions:    PRN Medications: acetaminophen, acetaminophen, ondansetron (ZOFRAN) IV, ondansetron   Assessment:  1. Acute systolic HF EF 25-30% 2. Acute delirium 3. LLE DVT 4. Physical deconditioning 5. Recent death of his wife in Apr 03, 20146. Hypokalemia 7. Protein-calorie malnutrition (Albumin 2.7)   Plan/Discussion:    He is not progressing very well. Still with marked edema and now renal function getting worse. Will continue IV lasix today. Place TED hose.   Continue ACE. No b-blocker yet.   I discussed  situation with his son and told him that I was quite worried about his HF. He confirmed that they would not want any invasive procedures. He noted that he has been deteriorating slowly since his wife's death in 03-10-23 but said that he had been pretty functional. I told him we would pursue aggressive medical therapy for now. Await results from Palliative Care meeting.  Not sure he will be good coumadin candidate unless he goes to SNF. ConsiderNOAC such as Xarelto or Eliquis depending on disposition.   William Kwong,MD 11:14 AM   Length of Stay: 3 Advanced Heart Failure Team Pager [phone] (M-F; 7a - 4p)  Please contact [facility] for night-coverage after hours (4p -7a ) and weekends on [email or web address]

## 2013-12-01 NOTE — Consult Note (Signed)
Patient RU:EAVW Astorga      DOB: 05-01-1925      UJW:[id number]  Summary of Goals of Care, full note to follow:  Met with son Peyton, [place].  Patient has limited insight into ability to care for himself and has had memory problems since his stroke.  His son Therron is aware of his CHF diagnosis, dvt diagnosis and states that his father has refused care for other medical illnesss in that past.  He does not believe a complex workup like cath etc would be in his father's interest.  He would prefer to maximize medical therapy and pursue SNF with [facility] and ultimately Hospice to follow.  We completed a MOST form which is on the chart.  Recommend:  1.  DNR/DNI  2.  CHF: maximize therapy for symptom management and comfort  3.  SW referral for SNF placement .  Son will be in town until Sunday would like to look at places.  4  MoST completed would like minor infections treated for comfort. But no IVF or feeding tubes.   Total time: 830 am - 925 am  Damione Robideau L. Ladona Ridgel, MD MBA The Palliative Medicine Team at [facility] Phone: [phone] Pager: [phone]

## 2013-12-01 NOTE — Progress Notes (Signed)
[facility] PROGRESS NOTE  William Wade ZOX:[id number] DOB: [birth year] DOA: 11/28/2013 PCP: William Fillers, MD  Assessment/Plan: 1-Acute encephalopathy in a patient with dementia ; MRI negative for stroke. UA negative. Chest x ray; pulmonary edema. TSH normal at 1.1,  B-12 at 1040, ammonia at 10.   2-Acute Systolic Heart Failure;  Continue with Lasix  80 mg IV BID. Appreciate Dr Gala Romney assistance. Mildly increase troponin. ECHO with Ef 25 to 30 %.  Weight 83 ---81 Kg.   3-Hypokalemia; resolved.  4-Anemia - as per patient's son patient has chronic anemia. Per the notes reviewed in the chart patient has previously refused a colonoscopy. Hb stable. Probably iron deficiency anemia.   5-Previous history of CVA - MRI negative for acute stroke.  6-left leg is positive for a non-occlusive short segment of deep vein thrombosis in the distal common femoral vein; Continue with Lovenox , pharmacy to dose. Patient likely discharge to SNF. Will start coumadin pharmacy to dose.  7-Cough: check chest x ray evaluate for PNA.    Code Status: DNR.  Family Communication: I spoke with son and grandson William Wade about patient medical condition. Son would like SNF placement. He agree to speak with palliative for goals of care while he is in town.  Disposition Plan: Remain in patient.    Consultants:  Cardiology  Procedures: ECHO: Left ventricle: The cavity size was mildly dilated. Wall thickness was normal. Systolic function was severely reduced. The estimated ejection fraction was in the range of 25% to 30%. Diffuse hypokinesis. There is akinesis of the inferior posterior myocardium. Features are consistent with a pseudonormal left ventricular filling pattern, with concomitant abnormal relaxation and increased filling pressure (grade 2 diastolic dysfunction). - Mitral valve: Moderate to severe regurgitation. - Left atrium: The atrium was mildly dilated. - Right atrium: The atrium was mildly  dilated. - Pulmonary arteries: Systolic pressure was mildly to moderately increased. PA peak pressure: 50mm Hg (S).    Antibiotics:  none  HPI/Subjective: No complaints, denies dyspnea. He doesn't know if he is breathing better.  Son notice cough.   Objective: Filed Vitals:   12/01/13 1051  BP: 125/71  Pulse:   Temp:   Resp:     Intake/Output Summary (Last 24 hours) at 12/01/13 1251 Last data filed at 12/01/13 0500  Gross per 24 hour  Intake    410 ml  Output    775 ml  Net   -365 ml   Filed Weights   11/30/13 0028 11/30/13 0427 12/01/13 0627  Weight: 83.553 kg (184 lb 3.2 oz) 83.553 kg (184 lb 3.2 oz) 81.557 kg (179 lb 12.8 oz)    Exam:   General:  Alert in no acute distress.   Cardiovascular: S 1, S 2 RRR  Respiratory: Crackles bilaterally.   Abdomen: BS present, soft, Nt  Musculoskeletal: bilateral LE edema.   Data Reviewed: Basic Metabolic Panel:  Recent Labs Lab 11/28/13 1446 11/28/13 2300 11/29/13 0450 11/30/13 0526 12/01/13 0530  NA 143  --  144 143 143  K 3.0*  --  3.6 4.3 4.5  CL 105  --  107 105 105  CO2 25  --  26 26 29   GLUCOSE 121*  --  94 202* 120*  BUN 37*  --  34* 37* 35*  CREATININE 1.08 1.05 1.13 1.14 1.31  CALCIUM 8.8  --  8.5 8.6 8.2*   Liver Function Tests:  Recent Labs Lab 11/28/13 1446 11/29/13 0450  AST 32 27  ALT 24 21  ALKPHOS 65 59  BILITOT 1.2 1.2  PROT 6.1 5.8*  ALBUMIN 2.9* 2.7*   No results found for this basename: LIPASE, AMYLASE,  in the last 168 hours  Recent Labs Lab 11/28/13 2300  AMMONIA 10*   CBC:  Recent Labs Lab 11/28/13 1446 11/28/13 2300 11/29/13 0450 11/30/13 0526  WBC 11.3* 8.8 8.2 13.1*  NEUTROABS 9.4*  --  5.3  --   HGB 10.8* 9.9* 10.2* 11.2*  HCT 32.3* 30.5* 31.1* 34.5*  MCV 97.6 97.8 98.4 98.9  PLT 185 192 191 193   Cardiac Enzymes:  Recent Labs Lab 11/28/13 1446 11/28/13 2300 11/29/13 0450 11/29/13 0945  TROPONINI <0.30 0.70* 0.46* <0.30   BNP (last 3  results)  Recent Labs  11/28/13 1446  PROBNP 12187.0*   CBG: No results found for this basename: GLUCAP,  in the last 168 hours  No results found for this or any previous visit (from the past 240 hour(s)).   Studies: No results found.  Scheduled Meds: . aspirin EC  81 mg Oral Daily  . enalapril  5 mg Oral Daily  . enoxaparin (LOVENOX) injection  1 mg/kg Subcutaneous Q12H  . feeding supplement (ENSURE COMPLETE)  237 mL Oral TID BM  . furosemide  80 mg Intravenous BID  . sodium chloride  3 mL Intravenous Q12H  . sodium chloride  3 mL Intravenous Q12H  . spironolactone  25 mg Oral Daily   Continuous Infusions:   Active Problems:   Acute encephalopathy   CHF (congestive heart failure)   Hypokalemia   Anemia   History of CVA (cerebrovascular accident)   Acute systolic heart failure    Time spent: 30 minutes.     William Wade  [facility] Pager [phone]. If 7PM-7AM, please contact night-coverage at [email or web address], password [facility] 12/01/2013, 12:51 PM  LOS: 3 days

## 2013-12-02 DIAGNOSIS — F0280 Dementia in other diseases classified elsewhere without behavioral disturbance: Secondary | ICD-10-CM

## 2013-12-02 DIAGNOSIS — I672 Cerebral atherosclerosis: Secondary | ICD-10-CM

## 2013-12-02 DIAGNOSIS — I82409 Acute embolism and thrombosis of unspecified deep veins of unspecified lower extremity: Secondary | ICD-10-CM

## 2013-12-02 DIAGNOSIS — Z515 Encounter for palliative care: Secondary | ICD-10-CM

## 2013-12-02 DIAGNOSIS — D509 Iron deficiency anemia, unspecified: Secondary | ICD-10-CM

## 2013-12-02 DIAGNOSIS — E875 Hyperkalemia: Secondary | ICD-10-CM

## 2013-12-02 DIAGNOSIS — I10 Essential (primary) hypertension: Secondary | ICD-10-CM

## 2013-12-02 DIAGNOSIS — I6350 Cerebral infarction due to unspecified occlusion or stenosis of unspecified cerebral artery: Secondary | ICD-10-CM

## 2013-12-02 DIAGNOSIS — J811 Chronic pulmonary edema: Secondary | ICD-10-CM

## 2013-12-02 DIAGNOSIS — J189 Pneumonia, unspecified organism: Secondary | ICD-10-CM

## 2013-12-02 LAB — PROTIME-INR: Prothrombin Time: 16.3 seconds — ABNORMAL HIGH (ref 11.6–15.2)

## 2013-12-02 LAB — BASIC METABOLIC PANEL
BUN: 32 mg/dL — ABNORMAL HIGH (ref 6–23)
Chloride: 99 mEq/L (ref 96–112)
Creatinine, Ser: 1.25 mg/dL (ref 0.50–1.35)
GFR calc Af Amer: 58 mL/min — ABNORMAL LOW (ref >90)
GFR calc non Af Amer: 50 mL/min — ABNORMAL LOW (ref >90)
Glucose, Bld: 106 mg/dL — ABNORMAL HIGH (ref 70–99)
Potassium: 5.3 mEq/L — ABNORMAL HIGH (ref 3.5–5.1)

## 2013-12-02 MED ORDER — LEVOFLOXACIN 750 MG PO TABS
750.0000 mg | ORAL_TABLET | ORAL | Status: DC
  Administered 2013-12-03: 750 mg via ORAL
  Filled 2013-12-02: qty 1

## 2013-12-02 MED ORDER — METOLAZONE 2.5 MG PO TABS
2.5000 mg | ORAL_TABLET | Freq: Once | ORAL | Status: AC
  Administered 2013-12-02: 2.5 mg via ORAL
  Filled 2013-12-02: qty 1

## 2013-12-02 MED ORDER — WARFARIN SODIUM 2.5 MG PO TABS
2.5000 mg | ORAL_TABLET | Freq: Once | ORAL | Status: AC
  Administered 2013-12-02: 2.5 mg via ORAL
  Filled 2013-12-02: qty 1

## 2013-12-02 MED ORDER — STARCH (THICKENING) PO POWD
ORAL | Status: DC | PRN
  Filled 2013-12-02: qty 227

## 2013-12-02 NOTE — Progress Notes (Signed)
ANTICOAGULATION CONSULT NOTE   Pharmacy Consult for lovenox>>warfarin Indication: DVT  No Known Allergies  Patient Measurements: Height: 6\' 2"  (188 cm) Weight: 168 lb 9.6 oz (76.476 kg) IBW/kg (Calculated) : 82.2   Vital Signs: Temp: 97.4 F (36.3 C) (12/27 0522) Temp src: Oral (12/27 0522) BP: 123/71 mmHg (12/27 0522) Pulse Rate: 79 (12/27 0522)  Labs:  Recent Labs  11/29/13 0945 11/30/13 0526 12/01/13 0530 12/02/13 0530  HGB  --  11.2*  --   --   HCT  --  34.5*  --   --   PLT  --  193  --   --   LABPROT  --  17.2*  --  16.3*  INR  --  1.44  --  1.34  CREATININE  --  1.14 1.31 1.25  TROPONINI <0.30  --   --   --     Estimated Creatinine Clearance: 45.9 ml/min (by C-G formula based on Cr of 1.25).   Medical History: Past Medical History  Diagnosis Date  . Stroke   . Dementia   . HTN (hypertension)    Assessment: 79 year old male admitted with altered mental status who fell and was found down. Dopplers performed show left leg is positive for a DVT in the distal common femoral vein. CBC has been stable and no bleeding has been noted. Scr stable.   Orders received to start warfarin. Given his elevated baseline INR, age, low weight will start with low dose. Today will be day 1/5 of vte overlap.  Goal of Therapy:  Anti-Xa level 0.6-1 units/ml 4hrs after LMWH dose given Monitor platelets by anticoagulation protocol: Yes INR goal 2-3   Plan:  Continue Warfarin 2.5 x1 tonight Lovenox 80mg  sq q 12 hours CBC q 72 hours while on lovenox - next 12/28  Osric Klopf M. Allena Katz, PharmD Clinical Pharmacist- Resident Pager: [phone] Pharmacy: [phone] 12/02/2013 8:57 AM

## 2013-12-02 NOTE — Consult Note (Signed)
Reason for Consult:Capacity evaluation Referring Physician: Dr. Orlean Bradford Class is an 79 y.o. male.  HPI: Patient is seen and chart reviewed. Patient was admitted to [facility] medical floor because of decreased mental status, recent history of fall at his home and decreased general functioning as per family report. William Wade is a 79 y.o. male was brought to the ER the patient was found on the floor in his house. In the ER CT head did not show anything acute. He has CHF and questionable medical compliance because he was unable to tell me that if he has any medical problems and also medication management. Patient does have history of progressive dementia which has worsened after his wife's death this 03-10-23. Patient usually lives alone and drives. His finances are managed by son. Patient has not wanted to go to any physicians. His son has arranged house visiting physicians.  MSE: Patient appeared sitting on a chair next to his bed, and has oxygen by canula. He has oriented to his name and stated that he stay at home by himself and does not take medication. He has no awareness of his medical conditions and needed medical care. He has severe memory deficits and not safe to stay by himself at this time. He denied symptoms of depression and anxiety. He has no evidence of psychosis. He has severe cognitive deficits.   Past Medical History  Diagnosis Date  . Stroke   . Dementia   . HTN (hypertension)     Past Surgical History  Procedure Laterality Date  . Wrist surgery    . Prostate surgery      Family History  Problem Relation Age of Onset  . Lung cancer Father     Social History:  reports that he has quit smoking. He does not have any smokeless tobacco history on file. He reports that he does not drink alcohol or use illicit drugs.  Allergies: No Known Allergies  Medications: I have reviewed the patient's current medications.  Results for orders placed during the hospital encounter  of 11/28/13 (from the past 48 hour(s))  BASIC METABOLIC PANEL     Status: Abnormal   Collection Time    12/01/13  5:30 AM      Result Value Range   Sodium 143  135 - 145 mEq/L   Potassium 4.5  3.5 - 5.1 mEq/L   Chloride 105  96 - 112 mEq/L   CO2 29  19 - 32 mEq/L   Glucose, Bld 120 (*) 70 - 99 mg/dL   BUN 35 (*) 6 - 23 mg/dL   Creatinine, Ser 4.09  0.50 - 1.35 mg/dL   Calcium 8.2 (*) 8.4 - 10.5 mg/dL   GFR calc non Af Amer 48 (*) >90 mL/min   GFR calc Af Amer 55 (*) >90 mL/min   Comment: (NOTE)     The eGFR has been calculated using the CKD EPI equation.     This calculation has not been validated in all clinical situations.     eGFR's persistently <90 mL/min signify possible Chronic Kidney     Disease.  BASIC METABOLIC PANEL     Status: Abnormal   Collection Time    12/02/13  5:30 AM      Result Value Range   Sodium 141  135 - 145 mEq/L   Potassium 5.3 (*) 3.5 - 5.1 mEq/L   Chloride 99  96 - 112 mEq/L   CO2 35 (*) 19 - 32 mEq/L  Glucose, Bld 106 (*) 70 - 99 mg/dL   BUN 32 (*) 6 - 23 mg/dL   Creatinine, Ser 1.61  0.50 - 1.35 mg/dL   Calcium 8.8  8.4 - 09.6 mg/dL   GFR calc non Af Amer 50 (*) >90 mL/min   GFR calc Af Amer 58 (*) >90 mL/min   Comment: (NOTE)     The eGFR has been calculated using the CKD EPI equation.     This calculation has not been validated in all clinical situations.     eGFR's persistently <90 mL/min signify possible Chronic Kidney     Disease.  PROTIME-INR     Status: Abnormal   Collection Time    12/02/13  5:30 AM      Result Value Range   Prothrombin Time 16.3 (*) 11.6 - 15.2 seconds   INR 1.34  0.00 - 1.49    Dg Chest 2 View  12/01/2013   CLINICAL DATA:  Cough, shortness of breath  EXAM: CHEST  2 VIEW  COMPARISON:  11/28/2013  FINDINGS: The lower borders and cardiac silhouette are obscured. Visualized portions appear enlarged. Small bilateral pleural effusions are appreciated which appear to have developed in the interim. There is decreased  conspicuity of the interstitial markings. Mild residual prominence is appreciated. There are areas of mild increased density within the lung bases with a focal area medially on the right. The osseous structures unremarkable.  IMPRESSION: 1. Interval development of bilateral effusions 2. Improvement in interstitial findings consistent with decreased pulmonary edema 3. Atelectasis versus infiltrate within the lung bases. Focal density projects within the left lung base with differential considerations of a focal region of consolidation no a mass within this area cannot be excluded. Continued surveillance evaluation is recommended.   Electronically Signed   By: Salome Holmes M.D.   On: 12/01/2013 14:53    Positive for sleep disturbance Blood pressure 120/70, pulse 79, temperature 97.4 F (36.3 C), temperature source Oral, resp. rate 18, height 6\' 2"  (1.88 m), weight 76.476 kg (168 lb 9.6 oz), SpO2 98.00%.   Assessment/Plan: Alzheimer's Dementia, severe   Recommendation: Patient does not meet criteria of capacity to make his own medical decision based on my evaluation Recommend his family should obtain MCPOA if needed May benefit from SNF placement No medication recommended Appreciate psychiatric consultaon and will sign off at this time   William Wade,JANARDHAHA R. 12/02/2013, 12:38 PM

## 2013-12-02 NOTE — Progress Notes (Signed)
Bed offers given to pt/pt's son.  Son has been visiting facilities and will continue to do so Sunday-Monday. Unit CSW to follow up on 12/29.

## 2013-12-02 NOTE — Progress Notes (Signed)
[facility] PROGRESS NOTE  William Wade ZOX:[id number] DOB: 15-Apr-1925 DOA: 11/28/2013 PCP: No primary provider on file.  Assessment/Plan: 79 year old with PMH significant for stroke, dementia, who was admitted with AMS, weakness, respiratory failure. Diagnosed with systolic heart failure Ef 25 to 30 %, DVT left. Cardiology helping with management of heart failure. Palliative was consulted. Plan is for SNF with hospice following.   1-Acute encephalopathy in a patient with dementia ; MRI negative for stroke. UA negative. Chest x ray; pulmonary edema. TSH normal at 1.1, B-12 at 1040, ammonia at 10. Psych consulted for capacity. Patient at some point was declining SNF. Appreciate psych evaluation. Patient does not have capacity.   2-Acute Systolic Heart Failure;  Continue with Lasix  80 mg IV BID. Appreciate Dr Gala Romney assistance. Mildly increase troponin. ECHO with Ef 25 to 30 %.  Weight 83 ---81 ---76 Kg.  Continue with spironolactone and ACE.  One dose of metolazone order.   3-Hypokalemia; resolved. Now increase potasium.  4-Anemia - as per patient's son patient has chronic anemia. Per the notes reviewed in the chart patient has previously refused a colonoscopy. Hb stable. Probably iron deficiency anemia.   5-Previous history of CVA - MRI negative for acute stroke.  6-left leg is positive for a non-occlusive short segment of deep vein thrombosis in the distal common femoral vein; Continue with Lovenox , pharmacy to dose. Patient likely discharge to SNF. Continue with coumadin.    7-PNA, concern for aspiration: chest x ray 12-26: Atelectasis versus infiltrate within the lung bases. Focal density projects within the left lung base with differential  considerations of a focal region of consolidation no a mass within this area cannot be excluded. Son aware of results.  -Started on Levaquin 12-26.  -Speech swallow evaluation. Now on Dysphagia 3 diet.     Code Status: DNR.  Family  Communication: I spoke with son and grandson William Wade about patient medical condition. Son would like SNF placement. He agree to speak with palliative for goals of care while he is in town.  Disposition Plan: Remain in patient.    Consultants:  Cardiology  Procedures: ECHO: Left ventricle: The cavity size was mildly dilated. Wall thickness was normal. Systolic function was severely reduced. The estimated ejection fraction was in the range of 25% to 30%. Diffuse hypokinesis. There is akinesis of the inferior posterior myocardium. Features are consistent with a pseudonormal left ventricular filling pattern, with concomitant abnormal relaxation and increased filling pressure (grade 2 diastolic dysfunction). - Mitral valve: Moderate to severe regurgitation. - Left atrium: The atrium was mildly dilated. - Right atrium: The atrium was mildly dilated. - Pulmonary arteries: Systolic pressure was mildly to moderately increased. PA peak pressure: 50mm Hg (S).    Antibiotics:  none  HPI/Subjective: No complaints, denies dyspnea. Cough persist.   Objective: Filed Vitals:   12/02/13 1400  BP: 108/52  Pulse: 85  Temp: 97.5 F (36.4 C)  Resp: 20    Intake/Output Summary (Last 24 hours) at 12/02/13 1653 Last data filed at 12/02/13 1414  Gross per 24 hour  Intake    480 ml  Output   1625 ml  Net  -1145 ml   Filed Weights   11/30/13 0427 12/01/13 0627 12/02/13 0522  Weight: 83.553 kg (184 lb 3.2 oz) 81.557 kg (179 lb 12.8 oz) 76.476 kg (168 lb 9.6 oz)    Exam:   General:  Alert in no acute distress.   Cardiovascular: S 1, S 2 RRR  Respiratory:  Crackles bilaterally.   Abdomen: BS present, soft, Nt  Musculoskeletal: bilateral LE edema.   Data Reviewed: Basic Metabolic Panel:  Recent Labs Lab 11/28/13 1446 11/28/13 2300 11/29/13 0450 11/30/13 0526 12/01/13 0530 12/02/13 0530  NA 143  --  144 143 143 141  K 3.0*  --  3.6 4.3 4.5 5.3*  CL 105  --  107 105 105 99   CO2 25  --  26 26 29  35*  GLUCOSE 121*  --  94 202* 120* 106*  BUN 37*  --  34* 37* 35* 32*  CREATININE 1.08 1.05 1.13 1.14 1.31 1.25  CALCIUM 8.8  --  8.5 8.6 8.2* 8.8   Liver Function Tests:  Recent Labs Lab 11/28/13 1446 11/29/13 0450  AST 32 27  ALT 24 21  ALKPHOS 65 59  BILITOT 1.2 1.2  PROT 6.1 5.8*  ALBUMIN 2.9* 2.7*   No results found for this basename: LIPASE, AMYLASE,  in the last 168 hours  Recent Labs Lab 11/28/13 2300  AMMONIA 10*   CBC:  Recent Labs Lab 11/28/13 1446 11/28/13 2300 11/29/13 0450 11/30/13 0526  WBC 11.3* 8.8 8.2 13.1*  NEUTROABS 9.4*  --  5.3  --   HGB 10.8* 9.9* 10.2* 11.2*  HCT 32.3* 30.5* 31.1* 34.5*  MCV 97.6 97.8 98.4 98.9  PLT 185 192 191 193   Cardiac Enzymes:  Recent Labs Lab 11/28/13 1446 11/28/13 2300 11/29/13 0450 11/29/13 0945  TROPONINI <0.30 0.70* 0.46* <0.30   BNP (last 3 results)  Recent Labs  11/28/13 1446  PROBNP 12187.0*   CBG: No results found for this basename: GLUCAP,  in the last 168 hours  No results found for this or any previous visit (from the past 240 hour(s)).   Studies: Dg Chest 2 View  12/01/2013   CLINICAL DATA:  Cough, shortness of breath  EXAM: CHEST  2 VIEW  COMPARISON:  11/28/2013  FINDINGS: The lower borders and cardiac silhouette are obscured. Visualized portions appear enlarged. Small bilateral pleural effusions are appreciated which appear to have developed in the interim. There is decreased conspicuity of the interstitial markings. Mild residual prominence is appreciated. There are areas of mild increased density within the lung bases with a focal area medially on the right. The osseous structures unremarkable.  IMPRESSION: 1. Interval development of bilateral effusions 2. Improvement in interstitial findings consistent with decreased pulmonary edema 3. Atelectasis versus infiltrate within the lung bases. Focal density projects within the left lung base with differential  considerations of a focal region of consolidation no a mass within this area cannot be excluded. Continued surveillance evaluation is recommended.   Electronically Signed   By: Salome Holmes M.D.   On: 12/01/2013 14:53    Scheduled Meds: . aspirin EC  81 mg Oral Daily  . enalapril  5 mg Oral Daily  . enoxaparin (LOVENOX) injection  1 mg/kg Subcutaneous Q12H  . feeding supplement (ENSURE COMPLETE)  237 mL Oral TID BM  . furosemide  80 mg Intravenous BID  . [START ON 12/03/2013] levofloxacin  750 mg Oral Q48H  . metolazone  2.5 mg Oral Once  . sodium chloride  3 mL Intravenous Q12H  . sodium chloride  3 mL Intravenous Q12H  . spironolactone  25 mg Oral Daily  . warfarin  2.5 mg Oral ONCE-1800  . Warfarin - Pharmacist Dosing Inpatient   Does not apply q1800   Continuous Infusions:   Active Problems:   Acute encephalopathy   CHF (congestive  heart failure)   Hypokalemia   Anemia   History of CVA (cerebrovascular accident)   Acute systolic heart failure    Time spent: 30 minutes.     William Wade  [facility] Pager [phone]. If 7PM-7AM, please contact night-coverage at [email or web address], password [email or web address] 12/02/2013, 4:53 PM  LOS: 4 days

## 2013-12-02 NOTE — Progress Notes (Signed)
RT approached by pt's RN and stated MD wanted to know "why pt hasn't been Nasotracheal suctioned yet?". Told RN i'd look into it, but hadn't heard anything about pt needing to be suctioned.There is an order for NTS prn. Pt assessed by RT. Pt had clear/diminished bbs, HR 94, RR 20, SpO2 98% on 4lpm. O2 weaned down to 3lpm. At this time, I do not feel pt needs to be nasotracheal suctioned. I will continue to check back and reassess pt. RT will continue to monitor.

## 2013-12-02 NOTE — Evaluation (Addendum)
Clinical/Bedside Swallow Evaluation Patient Details  Name: William Wade MRN: [id number] Date of Birth: 04-Aug-1925  Today's Date: 12/02/2013 Time: 1004-1025 SLP Time Calculation (min): 21 min  Past Medical History:  Past Medical History  Diagnosis Date  . Stroke   . Dementia   . HTN (hypertension)    Past Surgical History:  Past Surgical History  Procedure Laterality Date  . Wrist surgery    . Prostate surgery     HPI:  79 y.o. male was brought to the ER the patient was found on the floor in his house. As per patient's son with whom I spoke over the phone patient has not been doing well for last 3 days and has been found to be having change in mental status has found the patient's grandsons 3 days ago. As per patient's friend Mr. Lynnea Ferrier patient had a fall yesterday. He was brought to the ER. In the ER CT head did not show anything acute. Chest x-ray shows congestion concerning for CHF.Patient does have history of progressive dementia which has worsened after his wife's death this 2023-03-07. Patient usually lives alone and drives. His finances are managed by son. He does not cook. Patient has not wanted to go to any physicians. His son has arranged house visiting physicians.  Additional PMH includes CVA.  MRI revealed No acute stroke or other acute intracranial findings.  Remote left MCA territory infarction.  CXR 12/26 reveals Interval development of bilateral effusions, Improvement in interstitial findings consistent with decreased pulmonary edema,  Atelectasis versus infiltrate within the lung bases. Focal density projects within the left lung base with differential considerations of a focal region of consolidation no a mass within this area cannot be excluded. Continued surveillance evaluation is recommended.   Assessment / Plan / Recommendation Clinical Impression  Baseline cough present that pt.'s son reports is acute past several days.  Delayed coughing exhibited throughout swallow  evaluation with most consistencies as well as baseline cough posing difficutly detecting source.  Laryngeal elevation appeared functional.  Therapeutic intervention included diagnostic assessment with trials of nectar thick liquids and clinical explanation/reasoning to pt.'s son and importance of swallow precautions. Question if he may have chronic pharyngeal dyspahgia with symptoms with current illness (?).  Recommend diet modification to Dys 3 texture and nectar thick liquids with MBS next date, pills whole in applesauce.    Aspiration Risk  Moderate    Diet Recommendation Dysphagia 3 (Mechanical Soft);Nectar-thick liquid   Liquid Administration via: Cup;No straw Medication Administration: Whole meds with puree Supervision: Patient able to self feed;Full supervision/cueing for compensatory strategies Compensations: Slow rate;Small sips/bites Postural Changes and/or Swallow Maneuvers: Seated upright 90 degrees;Upright 30-60 min after meal    Other  Recommendations Recommended Consults: MBS Oral Care Recommendations: Oral care BID Other Recommendations: Order thickener from pharmacy   Follow Up Recommendations   (TBD)    Frequency and Duration min 2x/week  2 weeks   Pertinent Vitals/Pain No pain    SLP Swallow Goals     Swallow Study          Oral/Motor/Sensory Function Overall Oral Motor/Sensory Function: Appears within functional limits for tasks assessed   Ice Chips Ice chips: Not tested   Thin Liquid Thin Liquid: Impaired Presentation: Cup Pharyngeal  Phase Impairments: Cough - Delayed    Nectar Thick Nectar Thick Liquid: Impaired Presentation: Cup Pharyngeal Phase Impairments: Cough - Delayed   Honey Thick Honey Thick Liquid: Not tested   Puree Puree: Within functional limits   Solid  GO    Solid: Impaired Oral Phase Functional Implications:  (delayed transit)       Royce Macadamia M.Ed [employer] [phone]  12/02/2013

## 2013-12-02 NOTE — Progress Notes (Signed)
SUBJECTIVE: Pt responds "ok" to all questions regarding how he feels, and denies chest pain, palpitations, and shortness of breath. Sitting up in chair wearing nasal cannula. No family members present.     Intake/Output Summary (Last 24 hours) at 12/02/13 1126 Last data filed at 12/02/13 0522  Gross per 24 hour  Intake      0 ml  Output    375 ml  Net   -375 ml    Current Facility-Administered Medications  Medication Dose Route Frequency Provider Last Rate Last Dose  . acetaminophen (TYLENOL) tablet 650 mg  650 mg Oral Q6H PRN Eduard Clos, MD       Or  . acetaminophen (TYLENOL) suppository 650 mg  650 mg Rectal Q6H PRN Eduard Clos, MD      . aspirin EC tablet 81 mg  81 mg Oral Daily Dolores Patty, MD   81 mg at 12/02/13 1109  . enalapril (VASOTEC) tablet 5 mg  5 mg Oral Daily Dolores Patty, MD   5 mg at 12/02/13 1110  . enoxaparin (LOVENOX) injection 80 mg  1 mg/kg Subcutaneous Q12H Severiano Gilbert, RPH   80 mg at 12/02/13 1111  . feeding supplement (ENSURE COMPLETE) (ENSURE COMPLETE) liquid 237 mL  237 mL Oral TID BM Earna Coder, RD   237 mL at 12/01/13 1524  . food thickener (THICK IT) powder   Oral PRN Belkys A Regalado, MD      . furosemide (LASIX) injection 80 mg  80 mg Intravenous BID Dolores Patty, MD   80 mg at 12/02/13 1111  . [START ON 12/03/2013] levofloxacin (LEVAQUIN) tablet 750 mg  750 mg Oral Q48H Belkys A Regalado, MD      . ondansetron (ZOFRAN) tablet 4 mg  4 mg Oral Q6H PRN Eduard Clos, MD       Or  . ondansetron [facility]) injection 4 mg  4 mg Intravenous Q6H PRN Eduard Clos, MD      . sodium chloride 0.9 % injection 3 mL  3 mL Intravenous Q12H Eduard Clos, MD   3 mL at 12/01/13 2311  . sodium chloride 0.9 % injection 3 mL  3 mL Intravenous Q12H Eduard Clos, MD   3 mL at 12/01/13 1000  . spironolactone (ALDACTONE) tablet 25 mg  25 mg Oral Daily Dolores Patty, MD   25 mg at 12/02/13  1110  . warfarin (COUMADIN) tablet 2.5 mg  2.5 mg Oral ONCE-1800 Radha M Patel, RPH      . warfarin (COUMADIN) video   Does not apply Once Severiano Gilbert, [facility]      . Warfarin - Pharmacist Dosing Inpatient   Does not apply q1800 Severiano Gilbert, [facility]        Filed Vitals:   12/01/13 1315 12/01/13 2003 12/02/13 0522 12/02/13 1110  BP: 122/68 111/65 123/71 120/70  Pulse: 88 92 79   Temp: 98.5 F (36.9 C) 97.8 F (36.6 C) 97.4 F (36.3 C)   TempSrc: Oral Oral Oral   Resp: 15 18 18    Height:      Weight:   168 lb 9.6 oz (76.476 kg)   SpO2: 99% 100% 98%     PHYSICAL EXAM General: NAD Neck: No JVD, no thyromegaly or thyroid nodule.  Lungs: Clear to auscultation bilaterally with normal respiratory effort. CV: Nondisplaced PMI.  Regular rhythm, normal S1/S2, +S3, II/VI apical holosystolic murmur.  1+  pitting pretibial edema, wearing compression stockings.  No carotid bruit.  Normal pedal pulses.  Abdomen: Soft, nontender, no hepatosplenomegaly, no distention.  Neurologic: Alert and oriented x 3.  Psych: Normal affect. Extremities: No clubbing or cyanosis.   TELEMETRY: Reviewed telemetry pt in NSR, HR 90's, occasional PVC's.  LABS: Basic Metabolic Panel:  Recent Labs  16/10/96 0530 12/02/13 0530  NA 143 141  K 4.5 5.3*  CL 105 99  CO2 29 35*  GLUCOSE 120* 106*  BUN 35* 32*  CREATININE 1.31 1.25  CALCIUM 8.2* 8.8   Liver Function Tests: No results found for this basename: AST, ALT, ALKPHOS, BILITOT, PROT, ALBUMIN,  in the last 72 hours No results found for this basename: LIPASE, AMYLASE,  in the last 72 hours CBC:  Recent Labs  11/30/13 0526  WBC 13.1*  HGB 11.2*  HCT 34.5*  MCV 98.9  PLT 193   Cardiac Enzymes: No results found for this basename: CKTOTAL, CKMB, CKMBINDEX, TROPONINI,  in the last 72 hours BNP: No components found with this basename: POCBNP,  D-Dimer: No results found for this basename: DDIMER,  in the last 72 hours Hemoglobin A1C: No  results found for this basename: HGBA1C,  in the last 72 hours Fasting Lipid Panel: No results found for this basename: CHOL, HDL, LDLCALC, TRIG, CHOLHDL, LDLDIRECT,  in the last 72 hours Thyroid Function Tests: No results found for this basename: TSH, T4TOTAL, FREET3, T3FREE, THYROIDAB,  in the last 72 hours Anemia Panel: No results found for this basename: VITAMINB12, FOLATE, FERRITIN, TIBC, IRON, RETICCTPCT,  in the last 72 hours  RADIOLOGY: Dg Chest 2 View  12/01/2013   CLINICAL DATA:  Cough, shortness of breath  EXAM: CHEST  2 VIEW  COMPARISON:  11/28/2013  FINDINGS: The lower borders and cardiac silhouette are obscured. Visualized portions appear enlarged. Small bilateral pleural effusions are appreciated which appear to have developed in the interim. There is decreased conspicuity of the interstitial markings. Mild residual prominence is appreciated. There are areas of mild increased density within the lung bases with a focal area medially on the right. The osseous structures unremarkable.  IMPRESSION: 1. Interval development of bilateral effusions 2. Improvement in interstitial findings consistent with decreased pulmonary edema 3. Atelectasis versus infiltrate within the lung bases. Focal density projects within the left lung base with differential considerations of a focal region of consolidation no a mass within this area cannot be excluded. Continued surveillance evaluation is recommended.   Electronically Signed   By: Salome Holmes M.D.   On: 12/01/2013 14:53   Ct Head Wo Contrast  11/28/2013   CLINICAL DATA:  Altered mental status, history of stroke  EXAM: CT HEAD WITHOUT CONTRAST  TECHNIQUE: Contiguous axial images were obtained from the base of the skull through the vertex without intravenous contrast.  COMPARISON:  MRI brain dated 07/14/2006  FINDINGS: No evidence of parenchymal hemorrhage or extra-axial fluid collection. No mass lesion, mass effect, or midline shift.  No CT evidence  of acute infarction. Encephalomalacic changes related to prior left MCA distribution infarct.  Subcortical white matter and periventricular small vessel ischemic changes. Intracranial atherosclerosis.  Global cortical atrophy.  No ventriculomegaly.  The visualized paranasal sinuses are essentially clear. The mastoid air cells are unopacified.  No evidence of calvarial fracture.  IMPRESSION: No evidence of acute intracranial abnormality.  Encephalomalacic changes related to prior left MCA distribution infarct.  Atrophy with small vessel ischemic changes and intracranial atherosclerosis.   Electronically Signed   By: Lurlean Horns  Rito Ehrlich M.D.   On: 11/28/2013 19:26   Mr Brain Wo Contrast  11/29/2013   CLINICAL DATA:  Altered mental status. Acute encephalopathy. Dementia. Evaluate for stroke.  EXAM: MRI HEAD WITHOUT CONTRAST  TECHNIQUE: Multiplanar, multiecho pulse sequences of the brain and surrounding structures were obtained without intravenous contrast.  COMPARISON:  CT head 11/28/2013.  MR head 07/14/2006.  FINDINGS: The patient was unable to remain motionless for the exam. Small or subtle lesions could be overlooked.  Chronic encephalomalacia related to old left MCA territory infarction affecting much of the left hemisphere including the frontal, temporal, and parietal lobes, including the basal ganglia. No acute stroke is evident. There is no acute hemorrhage, mass lesion, hydrocephalus, or extra-axial fluid. Generalized cerebral and cerebellar atrophy. Chronic microvascular ischemic change affects the periventricular and subcortical white matter. Prominent perivascular spaces suggest chronic hypertension.  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 Some flow void signal is seen in the left MCA trunk. This LICA vessel occlusion was not present in 2007.  Pituitary, pineal, and cerebellar  tonsils unremarkable. No upper cervical lesions. There may be slight chronic hemorrhage associated with the old left basal ganglia infarct.  No osseous lesions are evident. Upper cervical region unremarkable. Bilateral cataract extraction. No sinus or mastoid disease.  Compared with 2007, there has been progressive atrophy. The left MCA hemispheric infarctions were acute at that time. The left basal ganglia infarct was present before 2007.  IMPRESSION: No acute stroke or other acute intracranial findings.  Remote left MCA territory infarction.  Occluded left internal carotid artery has occurred since 2007. There appears to be collateral flow into the left anterior circulation based on flow void signal within the left MCA.   Electronically Signed   By: Davonna Belling M.D.   On: 11/29/2013 09:53   Dg Chest Port 1 View  11/28/2013   CLINICAL DATA:  Acute mental status changes.  EXAM: PORTABLE CHEST - 1 VIEW  COMPARISON:  None.  FINDINGS: The lung bases are not included on this study. Visualized portion of the long bones demonstrate enlargement of the cardiac silhouette. There is prominence of the interstitial markings, indistinctness of the pulmonary vasculature, and peribronchial cuffing. The osseous structures demonstrate degenerative changes within the right lobe shoulders. No focal regions of consolidation appreciated.  IMPRESSION: Interstitial infiltrate likely reflecting pulmonary edema within the visualized portions of the chest.   Electronically Signed   By: Salome Holmes M.D.   On: 11/28/2013 15:40      ASSESSMENT AND PLAN: 1. Acute systolic heart failure, EF 25-30%: doubt accuracy of weights, as 179 lbs yesterday and 168 today. Continue spironolactone, enalapril, and IV Lasix (80 mg bid) at current doses. No beta blocker yet. Conservative therapies only. Will given one dose of metolazone 2.5 mg x 1 today. K 5.3. 2. Left leg DVT: now on enoxaparin and warfarin.  3. Hyperlalemia: mild, K 5.3. One  dose of metolazone 2.5 mg today. Will monitor.  Dispo: to SNF eventually, DNR/DNI code status. Pt defers aggressive management. Poor prognosis overall.   Prentice Docker, M.D., F.A.C.C.

## 2013-12-03 LAB — CBC
Hemoglobin: 9.7 g/dL — ABNORMAL LOW (ref 13.0–17.0)
MCH: 31.7 pg (ref 26.0–34.0)
MCHC: 31.9 g/dL (ref 30.0–36.0)
RBC: 3.06 MIL/uL — ABNORMAL LOW (ref 4.22–5.81)
RDW: 16 % — ABNORMAL HIGH (ref 11.5–15.5)

## 2013-12-03 LAB — PROTIME-INR
INR: 1.39 (ref 0.00–1.49)
Prothrombin Time: 16.7 seconds — ABNORMAL HIGH (ref 11.6–15.2)

## 2013-12-03 LAB — BASIC METABOLIC PANEL
CO2: 38 mEq/L — ABNORMAL HIGH (ref 19–32)
GFR calc non Af Amer: 48 mL/min — ABNORMAL LOW (ref >90)
Glucose, Bld: 103 mg/dL — ABNORMAL HIGH (ref 70–99)
Potassium: 4.1 mEq/L (ref 3.5–5.1)
Sodium: 136 mEq/L (ref 135–145)

## 2013-12-03 MED ORDER — STARCH (THICKENING) PO POWD
ORAL | Status: DC | PRN
  Filled 2013-12-03: qty 227

## 2013-12-03 MED ORDER — WARFARIN SODIUM 5 MG PO TABS
5.0000 mg | ORAL_TABLET | Freq: Once | ORAL | Status: AC
  Administered 2013-12-03: 5 mg via ORAL
  Filled 2013-12-03: qty 1

## 2013-12-03 NOTE — Progress Notes (Signed)
SLP Cancellation Note  Patient Details Name: William Wade MRN: [id number] DOB: 1925-01-19   Cancelled treatment:  MBS to be completed 12/04/13.  Moreen Fowler MS, CCC-SLP [phone] [facility] 12/03/2013, 5:20 PM

## 2013-12-03 NOTE — Progress Notes (Signed)
ANTICOAGULATION CONSULT NOTE   Pharmacy Consult for lovenox>>warfarin Indication: DVT  No Known Allergies  Patient Measurements: Height: 6\' 2"  (188 cm) Weight: 162 lb 4.1 oz (73.6 kg) IBW/kg (Calculated) : 82.2   Vital Signs: Temp: 97.8 F (36.6 C) (12/28 0530) Temp src: Oral (12/28 0530) BP: 108/47 mmHg (12/28 0530) Pulse Rate: 82 (12/28 0530)  Labs:  Recent Labs  12/01/13 0530 12/02/13 0530 12/03/13 0330  HGB  --   --  9.7*  HCT  --   --  30.4*  PLT  --   --  180  LABPROT  --  16.3* 16.7*  INR  --  1.34 1.39  CREATININE 1.31 1.25 1.29    Estimated Creatinine Clearance: 42.8 ml/min (by C-G formula based on Cr of 1.29).   Medical History: Past Medical History  Diagnosis Date  . Stroke   . Dementia   . HTN (hypertension)    Assessment: 79 year old male admitted with AMS who fell and was found down. Dopplers performed show left leg is positive for a DVT in the distal common femoral vein. Slight drop in Hgb, plt wnl and no bleeding has been noted. Scr stable.Today will be day 3/5 of vte overlap.  Goal of Therapy:  Anti-Xa level 0.6-1 units/ml 4hrs after LMWH dose given Monitor platelets by anticoagulation protocol: Yes INR goal 2-3   Plan:  Continue Warfarin 5 x1 tonight Lovenox 80mg  sq q 12 hours CBC q 72 hours while on lovenox - next 12/28  Abdulkadir Emmanuel M. Allena Katz, PharmD Clinical Pharmacist- Resident Pager: [phone] Pharmacy: [phone] 12/03/2013 8:23 AM

## 2013-12-03 NOTE — Progress Notes (Signed)
[facility] PROGRESS NOTE  William Wade ZOX:[id number] DOB: Sep 27, 1925 DOA: 11/28/2013 PCP: No primary provider on file.  Assessment/Plan: 1-Acute encephalopathy in a patient with dementia ; MRI negative for stroke. UA negative. Chest x ray; pulmonary edema. TSH normal at 1.1,  B-12 at 1040, ammonia at 10.   2-Acute Systolic Heart Failure;  Continue with Lasix  80 mg IV BID. Appreciate Dr Gala Romney assistance. Mildly increase troponin. ECHO with Ef 25 to 30 %.  Weight 83 ---81 Kg.   3-Hypokalemia; resolved.   4-Anemia - as per patient's son patient has chronic anemia. Per the notes reviewed in the chart patient has previously refused a colonoscopy. Hb stable. Probably iron deficiency anemia.   5-Previous history of CVA - MRI negative for acute stroke.   6-left leg is positive for a non-occlusive short segment of deep vein thrombosis in the distal common femoral vein; Continue with Lovenox , pharmacy to dose. Patient likely discharge to SNF. Will start coumadin pharmacy to dose.   7-Cough: check chest x ray evaluate for PNA.    Code Status: DNR.  Family Communication: I spoke with son and grandson William Wade about patient medical condition. Son would like SNF placement. MOST form completed after discussion with palliative care.  Disposition Plan: SNF with PCS   Consultants:  Cardiology  Procedures: ECHO: Left ventricle: The cavity size was mildly dilated. Wall thickness was normal. Systolic function was severely reduced. The estimated ejection fraction was in the range of 25% to 30%. Diffuse hypokinesis. There is akinesis of the inferior posterior myocardium. Features are consistent with a pseudonormal left ventricular filling pattern, with concomitant abnormal relaxation and increased filling pressure (grade 2 diastolic dysfunction). - Mitral valve: Moderate to severe regurgitation. - Left atrium: The atrium was mildly dilated. - Right atrium: The atrium was mildly  dilated. - Pulmonary arteries: Systolic pressure was mildly to moderately increased. PA peak pressure: 50mm Hg (S).    Antibiotics:  none  HPI/Subjective: Patient seen and examined, no new complaints.  Objective: Filed Vitals:   12/03/13 1057  BP: 120/58  Pulse:   Temp:   Resp:     Intake/Output Summary (Last 24 hours) at 12/03/13 1410 Last data filed at 12/03/13 1402  Gross per 24 hour  Intake    720 ml  Output   8100 ml  Net  -7380 ml   Filed Weights   12/01/13 0627 12/02/13 0522 12/03/13 0500  Weight: 81.557 kg (179 lb 12.8 oz) 76.476 kg (168 lb 9.6 oz) 73.6 kg (162 lb 4.1 oz)    Exam:  Physical Exam: Head: Normocephalic, atraumatic.  Eyes: No signs of jaundice, EOMI Nose: Mucous membranes dry.  Throat: Oropharynx nonerythematous, no exudate appreciated.  Neck: supple,No deformities, masses, or tenderness noted. Lungs: Normal respiratory effort. B/L Clear to auscultation, no crackles or wheezes.  Heart: Regular RR. S1 and S2 normal  Abdomen: BS normoactive. Soft, Nondistended, non-tender.  Extremities: No pretibial edema, no erythema   Data Reviewed: Basic Metabolic Panel:  Recent Labs Lab 11/29/13 0450 11/30/13 0526 12/01/13 0530 12/02/13 0530 12/03/13 0330  NA 144 143 143 141 136  K 3.6 4.3 4.5 5.3* 4.1  CL 107 105 105 99 92*  CO2 26 26 29  35* 38*  GLUCOSE 94 202* 120* 106* 103*  BUN 34* 37* 35* 32* 32*  CREATININE 1.13 1.14 1.31 1.25 1.29  CALCIUM 8.5 8.6 8.2* 8.8 8.6   Liver Function Tests:  Recent Labs Lab 11/28/13 1446 11/29/13 0450  AST 32 27  ALT 24 21  ALKPHOS 65 59  BILITOT 1.2 1.2  PROT 6.1 5.8*  ALBUMIN 2.9* 2.7*   No results found for this basename: LIPASE, AMYLASE,  in the last 168 hours  Recent Labs Lab 11/28/13 2300  AMMONIA 10*   CBC:  Recent Labs Lab 11/28/13 1446 11/28/13 2300 11/29/13 0450 11/30/13 0526 12/03/13 0330  WBC 11.3* 8.8 8.2 13.1* 7.1  NEUTROABS 9.4*  --  5.3  --   --   HGB 10.8* 9.9*  10.2* 11.2* 9.7*  HCT 32.3* 30.5* 31.1* 34.5* 30.4*  MCV 97.6 97.8 98.4 98.9 99.3  PLT 185 192 191 193 180   Cardiac Enzymes:  Recent Labs Lab 11/28/13 1446 11/28/13 2300 11/29/13 0450 11/29/13 0945  TROPONINI <0.30 0.70* 0.46* <0.30   BNP (last 3 results)  Recent Labs  11/28/13 1446  PROBNP 12187.0*   CBG: No results found for this basename: GLUCAP,  in the last 168 hours  No results found for this or any previous visit (from the past 240 hour(s)).   Studies: Dg Chest 2 View  12/01/2013   CLINICAL DATA:  Cough, shortness of breath  EXAM: CHEST  2 VIEW  COMPARISON:  11/28/2013  FINDINGS: The lower borders and cardiac silhouette are obscured. Visualized portions appear enlarged. Small bilateral pleural effusions are appreciated which appear to have developed in the interim. There is decreased conspicuity of the interstitial markings. Mild residual prominence is appreciated. There are areas of mild increased density within the lung bases with a focal area medially on the right. The osseous structures unremarkable.  IMPRESSION: 1. Interval development of bilateral effusions 2. Improvement in interstitial findings consistent with decreased pulmonary edema 3. Atelectasis versus infiltrate within the lung bases. Focal density projects within the left lung base with differential considerations of a focal region of consolidation no a mass within this area cannot be excluded. Continued surveillance evaluation is recommended.   Electronically Signed   By: Salome Holmes M.D.   On: 12/01/2013 14:53    Scheduled Meds: . aspirin EC  81 mg Oral Daily  . enalapril  5 mg Oral Daily  . enoxaparin (LOVENOX) injection  1 mg/kg Subcutaneous Q12H  . feeding supplement (ENSURE COMPLETE)  237 mL Oral TID BM  . furosemide  80 mg Intravenous BID  . levofloxacin  750 mg Oral Q48H  . sodium chloride  3 mL Intravenous Q12H  . sodium chloride  3 mL Intravenous Q12H  . spironolactone  25 mg Oral Daily   . warfarin  5 mg Oral ONCE-1800  . Warfarin - Pharmacist Dosing Inpatient   Does not apply q1800   Continuous Infusions:   Active Problems:   Acute encephalopathy   CHF (congestive heart failure)   Hypokalemia   Anemia   History of CVA (cerebrovascular accident)   Acute systolic heart failure   PNA (pneumonia)    Time spent: 30 minutes.     [facility]  [facility] Pager [phone]. If 7PM-7AM, please contact night-coverage at [email or web address], password [facility] 12/03/2013, 2:10 PM  LOS: 5 days

## 2013-12-03 NOTE — Progress Notes (Signed)
Patient William Wade      DOB: [birth year]      JYN:[id number]   Patient easily awakens.  Less rhonchi today.  Son not at bedside. Noted psych eval and speech eval.  Will continue to help as needed.  Plan SNF with PCS.  Lajoya Dombek L. Ladona Ridgel, MD MBA The Palliative Medicine Team at [facility] Phone: [phone] Pager: [phone]

## 2013-12-03 NOTE — Progress Notes (Signed)
Pt concern about pulling out too much fluids with furosemide use. Patient slightly confused last night/shift change. Kindly address. Thanks!

## 2013-12-03 NOTE — Progress Notes (Signed)
SUBJECTIVE:   Pt would not talk today.  Very lethargic.  I note palliative care is now involved.     Intake/Output Summary (Last 24 hours) at 12/03/13 1030 Last data filed at 12/03/13 8469  Gross per 24 hour  Intake    600 ml  Output   7150 ml  Net  -6550 ml    Current Facility-Administered Medications  Medication Dose Route Frequency Provider Last Rate Last Dose  . acetaminophen (TYLENOL) tablet 650 mg  650 mg Oral Q6H PRN Eduard Clos, MD       Or  . acetaminophen (TYLENOL) suppository 650 mg  650 mg Rectal Q6H PRN Eduard Clos, MD      . aspirin EC tablet 81 mg  81 mg Oral Daily Dolores Patty, MD   81 mg at 12/02/13 1109  . enalapril (VASOTEC) tablet 5 mg  5 mg Oral Daily Dolores Patty, MD   5 mg at 12/02/13 1110  . enoxaparin (LOVENOX) injection 80 mg  1 mg/kg Subcutaneous Q12H Severiano Gilbert, RPH   80 mg at 12/02/13 2217  . feeding supplement (ENSURE COMPLETE) (ENSURE COMPLETE) liquid 237 mL  237 mL Oral TID BM Earna Coder, RD   237 mL at 12/02/13 2011  . food thickener (THICK IT) powder   Oral PRN Belkys A Regalado, MD      . furosemide (LASIX) injection 80 mg  80 mg Intravenous BID Dolores Patty, MD   80 mg at 12/02/13 1734  . levofloxacin (LEVAQUIN) tablet 750 mg  750 mg Oral Q48H Belkys A Regalado, MD      . ondansetron (ZOFRAN) tablet 4 mg  4 mg Oral Q6H PRN Eduard Clos, MD       Or  . ondansetron [facility]) injection 4 mg  4 mg Intravenous Q6H PRN Eduard Clos, MD      . sodium chloride 0.9 % injection 3 mL  3 mL Intravenous Q12H Eduard Clos, MD   3 mL at 12/02/13 2217  . sodium chloride 0.9 % injection 3 mL  3 mL Intravenous Q12H Eduard Clos, MD   3 mL at 12/01/13 1000  . spironolactone (ALDACTONE) tablet 25 mg  25 mg Oral Daily Dolores Patty, MD   25 mg at 12/02/13 1110  . warfarin (COUMADIN) tablet 5 mg  5 mg Oral ONCE-1800 Meredeth Ide, MD      . Warfarin - Pharmacist Dosing Inpatient    Does not apply q1800 Severiano Gilbert, [facility]        Filed Vitals:   12/02/13 1400 12/02/13 2052 12/03/13 0500 12/03/13 0530  BP: 108/52 103/48  108/47  Pulse: 85 91  82  Temp: 97.5 F (36.4 C) 98.2 F (36.8 C)  97.8 F (36.6 C)  TempSrc: Oral Oral  Oral  Resp: 20 18  18   Height:      Weight:   162 lb 4.1 oz (73.6 kg)   SpO2: 100% 98%  96%    PHYSICAL EXAM General: pat is very lethargic today  No JVD, no thyromegaly or thyroid nodule.  Lungs: Clear to auscultation bilaterally with normal respiratory effort. CV: Nondisplaced PMI.  Regular rhythm, normal S1/S2, +S3, II/VI apical holosystolic murmur.  1+ pitting pretibial edema, wearing compression stockings.  No carotid bruit.  Normal pedal pulses.  Abdomen: Soft, nontender, no hepatosplenomegaly, no distention.  Neurologic: Alert and oriented x 3.  Psych: Normal affect. Extremities:  No clubbing or cyanosis.   TELEMETRY: Reviewed telemetry pt in NSR, HR 90's, occasional PVC's.  LABS: Basic Metabolic Panel:  Recent Labs  65/78/46 0530 12/03/13 0330  NA 141 136  K 5.3* 4.1  CL 99 92*  CO2 35* 38*  GLUCOSE 106* 103*  BUN 32* 32*  CREATININE 1.25 1.29  CALCIUM 8.8 8.6   Liver Function Tests: No results found for this basename: AST, ALT, ALKPHOS, BILITOT, PROT, ALBUMIN,  in the last 72 hours No results found for this basename: LIPASE, AMYLASE,  in the last 72 hours CBC:  Recent Labs  12/03/13 0330  WBC 7.1  HGB 9.7*  HCT 30.4*  MCV 99.3  PLT 180   Cardiac Enzymes: No results found for this basename: CKTOTAL, CKMB, CKMBINDEX, TROPONINI,  in the last 72 hours BNP: No components found with this basename: POCBNP,  D-Dimer: No results found for this basename: DDIMER,  in the last 72 hours Hemoglobin A1C: No results found for this basename: HGBA1C,  in the last 72 hours Fasting Lipid Panel: No results found for this basename: CHOL, HDL, LDLCALC, TRIG, CHOLHDL, LDLDIRECT,  in the last 72 hours Thyroid Function  Tests: No results found for this basename: TSH, T4TOTAL, FREET3, T3FREE, THYROIDAB,  in the last 72 hours Anemia Panel: No results found for this basename: VITAMINB12, FOLATE, FERRITIN, TIBC, IRON, RETICCTPCT,  in the last 72 hours  RADIOLOGY: Dg Chest 2 View  12/01/2013   CLINICAL DATA:  Cough, shortness of breath  EXAM: CHEST  2 VIEW  COMPARISON:  11/28/2013  FINDINGS: The lower borders and cardiac silhouette are obscured. Visualized portions appear enlarged. Small bilateral pleural effusions are appreciated which appear to have developed in the interim. There is decreased conspicuity of the interstitial markings. Mild residual prominence is appreciated. There are areas of mild increased density within the lung bases with a focal area medially on the right. The osseous structures unremarkable.  IMPRESSION: 1. Interval development of bilateral effusions 2. Improvement in interstitial findings consistent with decreased pulmonary edema 3. Atelectasis versus infiltrate within the lung bases. Focal density projects within the left lung base with differential considerations of a focal region of consolidation no a mass within this area cannot be excluded. Continued surveillance evaluation is recommended.   Electronically Signed   By: Salome Holmes M.D.   On: 12/01/2013 14:53   Ct Head Wo Contrast  11/28/2013   CLINICAL DATA:  Altered mental status, history of stroke  EXAM: CT HEAD WITHOUT CONTRAST  TECHNIQUE: Contiguous axial images were obtained from the base of the skull through the vertex without intravenous contrast.  COMPARISON:  MRI brain dated 07/14/2006  FINDINGS: No evidence of parenchymal hemorrhage or extra-axial fluid collection. No mass lesion, mass effect, or midline shift.  No CT evidence of acute infarction. Encephalomalacic changes related to prior left MCA distribution infarct.  Subcortical white matter and periventricular small vessel ischemic changes. Intracranial atherosclerosis.  Global  cortical atrophy.  No ventriculomegaly.  The visualized paranasal sinuses are essentially clear. The mastoid air cells are unopacified.  No evidence of calvarial fracture.  IMPRESSION: No evidence of acute intracranial abnormality.  Encephalomalacic changes related to prior left MCA distribution infarct.  Atrophy with small vessel ischemic changes and intracranial atherosclerosis.   Electronically Signed   By: Charline Bills M.D.   On: 11/28/2013 19:26   Mr Brain Wo Contrast  11/29/2013   CLINICAL DATA:  Altered mental status. Acute encephalopathy. Dementia. Evaluate for stroke.  EXAM: MRI HEAD  WITHOUT CONTRAST  TECHNIQUE: Multiplanar, multiecho pulse sequences of the brain and surrounding structures were obtained without intravenous contrast.  COMPARISON:  CT head 11/28/2013.  MR head 07/14/2006.  FINDINGS: The patient was unable to remain motionless for the exam. Small or subtle lesions could be overlooked.  Chronic encephalomalacia related to old left MCA territory infarction affecting much of the left hemisphere including the frontal, temporal, and parietal lobes, including the basal ganglia. No acute stroke is evident. There is no acute hemorrhage, mass lesion, hydrocephalus, or extra-axial fluid. Generalized cerebral and cerebellar atrophy. Chronic microvascular ischemic change affects the periventricular and subcortical white matter. Prominent perivascular spaces suggest chronic hypertension.  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 Some flow void signal is seen in the left MCA trunk. This LICA vessel occlusion was not present in 2007.  Pituitary, pineal, and cerebellar tonsils unremarkable. No upper cervical lesions. There may be slight chronic hemorrhage associated with the old left basal ganglia infarct.  No osseous lesions are evident. Upper cervical region unremarkable.  Bilateral cataract extraction. No sinus or mastoid disease.  Compared with 2007, there has been progressive atrophy. The left MCA hemispheric infarctions were acute at that time. The left basal ganglia infarct was present before 2007.  IMPRESSION: No acute stroke or other acute intracranial findings.  Remote left MCA territory infarction.  Occluded left internal carotid artery has occurred since 2007. There appears to be collateral flow into the left anterior circulation based on flow void signal within the left MCA.   Electronically Signed   By: Davonna Belling M.D.   On: 11/29/2013 09:53   Dg Chest Port 1 View  11/28/2013   CLINICAL DATA:  Acute mental status changes.  EXAM: PORTABLE CHEST - 1 VIEW  COMPARISON:  None.  FINDINGS: The lung bases are not included on this study. Visualized portion of the long bones demonstrate enlargement of the cardiac silhouette. There is prominence of the interstitial markings, indistinctness of the pulmonary vasculature, and peribronchial cuffing. The osseous structures demonstrate degenerative changes within the right lobe shoulders. No focal regions of consolidation appreciated.  IMPRESSION: Interstitial infiltrate likely reflecting pulmonary edema within the visualized portions of the chest.   Electronically Signed   By: Salome Holmes M.D.   On: 11/28/2013 15:40      ASSESSMENT AND PLAN: 1. Acute systolic heart failure, EF 25-30%: doubt accuracy of weights, as 179 lbs yesterday and 168 today. Continue spironolactone, enalapril, and IV Lasix (80 mg bid) at current doses. No beta blocker yet. Conservative therapies only. Will given one dose of metolazone 2.5 mg x 1 today. K 5.3. 2. Left leg DVT: now on enoxaparin and warfarin.  3. Hyperlalemia: mild, K 5.3. One dose of metolazone 2.5 mg today. Will monitor.  Dispo: to SNF eventually, DNR/DNI code status. Pt defers aggressive management. Poor prognosis overall.   No new recs.   Vesta Mixer, Montez Hageman., MD,  [facility] 12/03/2013, 10:37 AM Office - [phone] Pager [phone]

## 2013-12-04 ENCOUNTER — Other Ambulatory Visit: Payer: Self-pay | Admitting: Physician Assistant

## 2013-12-04 ENCOUNTER — Non-Acute Institutional Stay (SKILLED_NURSING_FACILITY): Payer: Medicare Other | Admitting: Internal Medicine

## 2013-12-04 ENCOUNTER — Inpatient Hospital Stay (HOSPITAL_COMMUNITY): Payer: Medicare Other

## 2013-12-04 DIAGNOSIS — J189 Pneumonia, unspecified organism: Secondary | ICD-10-CM

## 2013-12-04 DIAGNOSIS — G9340 Encephalopathy, unspecified: Secondary | ICD-10-CM

## 2013-12-04 DIAGNOSIS — Z8673 Personal history of transient ischemic attack (TIA), and cerebral infarction without residual deficits: Secondary | ICD-10-CM

## 2013-12-04 DIAGNOSIS — I10 Essential (primary) hypertension: Secondary | ICD-10-CM

## 2013-12-04 DIAGNOSIS — I4892 Unspecified atrial flutter: Secondary | ICD-10-CM

## 2013-12-04 DIAGNOSIS — I82412 Acute embolism and thrombosis of left femoral vein: Secondary | ICD-10-CM

## 2013-12-04 DIAGNOSIS — F0390 Unspecified dementia without behavioral disturbance: Secondary | ICD-10-CM

## 2013-12-04 DIAGNOSIS — I824Y9 Acute embolism and thrombosis of unspecified deep veins of unspecified proximal lower extremity: Secondary | ICD-10-CM

## 2013-12-04 DIAGNOSIS — I5021 Acute systolic (congestive) heart failure: Secondary | ICD-10-CM

## 2013-12-04 DIAGNOSIS — D649 Anemia, unspecified: Secondary | ICD-10-CM

## 2013-12-04 DIAGNOSIS — Z7189 Other specified counseling: Secondary | ICD-10-CM

## 2013-12-04 LAB — BASIC METABOLIC PANEL
CO2: 39 mEq/L — ABNORMAL HIGH (ref 19–32)
Calcium: 8.5 mg/dL (ref 8.4–10.5)
Chloride: 90 mEq/L — ABNORMAL LOW (ref 96–112)
GFR calc Af Amer: 46 mL/min — ABNORMAL LOW (ref >90)
Sodium: 136 mEq/L (ref 135–145)

## 2013-12-04 LAB — PROTIME-INR
INR: 1.37 (ref 0.00–1.49)
Prothrombin Time: 16.5 seconds — ABNORMAL HIGH (ref 11.6–15.2)

## 2013-12-04 MED ORDER — FUROSEMIDE 40 MG PO TABS: 60.0000 mg | ORAL_TABLET | Freq: Two times a day (BID) | ORAL | Status: DC

## 2013-12-04 MED ORDER — FUROSEMIDE 40 MG PO TABS: 40.0000 mg | ORAL_TABLET | Freq: Two times a day (BID) | ORAL | Status: DC

## 2013-12-04 MED ORDER — WARFARIN SODIUM 7.5 MG PO TABS: 7.5000 mg | ORAL_TABLET | Freq: Every day | ORAL | Status: DC

## 2013-12-04 MED ORDER — METOPROLOL SUCCINATE ER 25 MG PO TB24: 25.0000 mg | ORAL_TABLET | Freq: Every day | ORAL | Status: DC

## 2013-12-04 MED ORDER — ENSURE COMPLETE PO LIQD: 237.0000 mL | Freq: Three times a day (TID) | ORAL | Status: DC

## 2013-12-04 MED ORDER — ENALAPRIL MALEATE 5 MG PO TABS: 5.0000 mg | ORAL_TABLET | Freq: Every day | ORAL | Status: AC

## 2013-12-04 MED ORDER — ENOXAPARIN SODIUM 80 MG/0.8ML ~~LOC~~ SOLN
70.0000 mg | Freq: Two times a day (BID) | SUBCUTANEOUS | Status: DC
  Administered 2013-12-04: 70 mg via SUBCUTANEOUS
  Filled 2013-12-04 (×2): qty 0.8

## 2013-12-04 MED ORDER — SPIRONOLACTONE 25 MG PO TABS: 25.0000 mg | ORAL_TABLET | Freq: Every day | ORAL | Status: AC

## 2013-12-04 MED ORDER — LEVOFLOXACIN 750 MG PO TABS: 750.0000 mg | ORAL_TABLET | ORAL | Status: DC

## 2013-12-04 MED ORDER — METOPROLOL SUCCINATE ER 25 MG PO TB24
25.0000 mg | ORAL_TABLET | Freq: Every day | ORAL | Status: DC
  Administered 2013-12-04: 25 mg via ORAL
  Filled 2013-12-04: qty 1

## 2013-12-04 MED ORDER — STARCH (THICKENING) PO POWD: ORAL | Status: DC

## 2013-12-04 MED ORDER — ENOXAPARIN SODIUM 80 MG/0.8ML ~~LOC~~ SOLN: 70.0000 mg | Freq: Two times a day (BID) | SUBCUTANEOUS | Status: DC

## 2013-12-04 MED ORDER — WARFARIN SODIUM 7.5 MG PO TABS
7.5000 mg | ORAL_TABLET | Freq: Once | ORAL | Status: DC
  Filled 2013-12-04: qty 1

## 2013-12-04 NOTE — Progress Notes (Signed)
Error

## 2013-12-04 NOTE — Care Management Note (Signed)
    Page 1 of 1   12/04/2013     12:03:35 PM   CARE MANAGEMENT NOTE 12/04/2013  Patient:  William Wade, William Wade   Account Number:  [id number]  Date Initiated:  12/04/2013  Documentation initiated by:  GRAVES-BIGELOW,Esthefany Herrig  Subjective/Objective Assessment:   Pt admitted for AMS. Plan for d/c to SNF when medically stable.     Action/Plan:   CSW assisting with disposition needs. No needs from CM at this time.   Anticipated DC Date:  12/04/2013   Anticipated DC Plan:  SKILLED NURSING FACILITY  In-house referral  Clinical Social Worker      DC Planning Services  CM consult      Choice offered to / List presented to:             Status of service:  Completed, signed off Medicare Important Message given?   (If response is "NO", the following Medicare IM given date fields will be blank) Date Medicare IM given:   Date Additional Medicare IM given:    Discharge Disposition:  SKILLED NURSING FACILITY  Per UR Regulation:  Reviewed for med. necessity/level of care/duration of stay  If discussed at Long Length of Stay Meetings, dates discussed:    Comments:

## 2013-12-04 NOTE — Progress Notes (Addendum)
Notified by central telemetry that pt went into afib/flutter around 1110 today.  HR=90's.  BP low at 84/40.  Pt states he feels lightheaded.  Dr. Sharl Ma and cardiology Alinda Money, [place]) notified.

## 2013-12-04 NOTE — Progress Notes (Signed)
ANTICOAGULATION CONSULT NOTE - Follow Up Consult  Pharmacy Consult for Lovenox and Coumadin Indication: DVT  No Known Allergies  Patient Measurements: Height: 6\' 2"  (188 cm) Weight: 153 lb 10.6 oz (69.7 kg) IBW/kg (Calculated) : 82.2  Vital Signs: Temp: 97.5 F (36.4 C) (12/29 0533) Temp src: Oral (12/29 0533) BP: 100/46 mmHg (12/29 0533) Pulse Rate: 94 (12/29 0533)  Labs:  Recent Labs  12/02/13 0530 12/03/13 0330 12/04/13 0448  HGB  --  9.7*  --   HCT  --  30.4*  --   PLT  --  180  --   LABPROT 16.3* 16.7* 16.5*  INR 1.34 1.39 1.37  CREATININE 1.25 1.29 1.53*    Estimated Creatinine Clearance: 34.2 ml/min (by C-G formula based on Cr of 1.53).  Assessment: 88yom continues on day #4/5 overlap with lovenox and coumadin for LLE DVT (12/24). INR remains subtherapeutic with no movement after 3 doses of coumadin. Continues on levaquin which can elevate INR. Patient's weight has decreased since lovenox initiation so will adjust dose. Will need to watch carefully as renal function worse today, although CrCl still > 30 ml/min.   Goal of Therapy:  INR 2-3 Anti Xa 0.6-1.2 4 hours after LMWH given Monitor platelets by anticoagulation protocol: Yes   Plan:  1) Change lovenox to 70mg  sq q12 2) Increase coumadin to 7.5mg  x 1 3) INR in AM  He will need at least 5 days overlap therapy.  Fredrik Rigger 12/04/2013,10:22 AM

## 2013-12-04 NOTE — Progress Notes (Signed)
CSW (Clinical Social Worker) prepared pt dc packet and placed with shadow chart. CSW arranged non-emergent ambulance transport. Pt, pt family, pt nurse, and facility informed. CSW signing off.  Magon Croson, LCSWA [phone]  

## 2013-12-04 NOTE — Progress Notes (Signed)
Patient Name: William Wade Date of Encounter: 12/04/2013     Active Problems:   Acute encephalopathy   CHF (congestive heart failure)   Hypokalemia   Anemia   History of CVA (cerebrovascular accident)   Acute systolic heart failure   PNA (pneumonia)    SUBJECTIVE: Awake, responsive. Breathing unchanged from yesterday. No pain.    OBJECTIVE  Filed Vitals:   12/03/13 1400 12/03/13 2237 12/04/13 0300 12/04/13 0533  BP: 119/65 113/47  100/46  Pulse: 95 90  94  Temp: 98.7 F (37.1 C) 97.7 F (36.5 C)  97.5 F (36.4 C)  TempSrc: Oral Oral  Oral  Resp: 16 16  18   Height:      Weight:   69.7 kg (153 lb 10.6 oz)   SpO2: 93% 92%  92%    Intake/Output Summary (Last 24 hours) at 12/04/13 0954 Last data filed at 12/04/13 0434  Gross per 24 hour  Intake    840 ml  Output   4100 ml  Net  -3260 ml   Weight change: -3.9 kg (-8 lb 9.6 oz)  PHYSICAL EXAM  General: Cachectic, in no acute distress. Head: Normocephalic, atraumatic, sclera non-icteric, no xanthomas, nares are without discharge. Strap muscles prominent.  Neck: Supple without bruits or JVD. Lungs:  Resp regular and unlabored, CTAB with no appreciable rales, rhonchi or wheezes Heart: RRR no s3, s4, or murmurs. Abdomen: Soft, non-tender, non-distended, BS + x 4.  Msk:  Strength and tone appears normal for age. Extremities: No clubbing, cyanosis or edema. DP/PT/Radials 2+ and equal bilaterally. Neuro: Alert and oriented X 1 to last name. Moves all extremities spontaneously. Psych: Flat affect.  LABS:  Recent Labs     12/03/13  0330  WBC  7.1  HGB  9.7*  HCT  30.4*  MCV  99.3  PLT  180    Recent Labs Lab 11/29/13 0450  12/02/13 0530 12/03/13 0330 12/04/13 0448  NA 144  < > 141 136 136  K 3.6  < > 5.3* 4.1 4.0  CL 107  < > 99 92* 90*  CO2 26  < > 35* 38* 39*  BUN 34*  < > 32* 32* 36*  CREATININE 1.13  < > 1.25 1.29 1.53*  CALCIUM 8.5  < > 8.8 8.6 8.5  PROT 5.8*  --   --   --   --   BILITOT 1.2   --   --   --   --   ALKPHOS 59  --   --   --   --   ALT 21  --   --   --   --   AST 27  --   --   --   --   GLUCOSE 94  < > 106* 103* 190*  < > = values in this interval not displayed.   TELE: NSR with occasional PVCs, HR 80-90s  Radiology/Studies:  Dg Chest 2 View  12/01/2013   CLINICAL DATA:  Cough, shortness of breath  EXAM: CHEST  2 VIEW  COMPARISON:  11/28/2013  FINDINGS: The lower borders and cardiac silhouette are obscured. Visualized portions appear enlarged. Small bilateral pleural effusions are appreciated which appear to have developed in the interim. There is decreased conspicuity of the interstitial markings. Mild residual prominence is appreciated. There are areas of mild increased density within the lung bases with a focal area medially on the right. The osseous structures unremarkable.  IMPRESSION: 1. Interval development of bilateral effusions  2. Improvement in interstitial findings consistent with decreased pulmonary edema 3. Atelectasis versus infiltrate within the lung bases. Focal density projects within the left lung base with differential considerations of a focal region of consolidation no a mass within this area cannot be excluded. Continued surveillance evaluation is recommended.   Electronically Signed   By: Salome Holmes M.D.   On: 12/01/2013 14:53   Ct Head Wo Contrast  11/28/2013   CLINICAL DATA:  Altered mental status, history of stroke  EXAM: CT HEAD WITHOUT CONTRAST  TECHNIQUE: Contiguous axial images were obtained from the base of the skull through the vertex without intravenous contrast.  COMPARISON:  MRI brain dated 07/14/2006  FINDINGS: No evidence of parenchymal hemorrhage or extra-axial fluid collection. No mass lesion, mass effect, or midline shift.  No CT evidence of acute infarction. Encephalomalacic changes related to prior left MCA distribution infarct.  Subcortical white matter and periventricular small vessel ischemic changes. Intracranial  atherosclerosis.  Global cortical atrophy.  No ventriculomegaly.  The visualized paranasal sinuses are essentially clear. The mastoid air cells are unopacified.  No evidence of calvarial fracture.  IMPRESSION: No evidence of acute intracranial abnormality.  Encephalomalacic changes related to prior left MCA distribution infarct.  Atrophy with small vessel ischemic changes and intracranial atherosclerosis.   Electronically Signed   By: Charline Bills M.D.   On: 11/28/2013 19:26   Mr Brain Wo Contrast  11/29/2013   CLINICAL DATA:  Altered mental status. Acute encephalopathy. Dementia. Evaluate for stroke.  EXAM: MRI HEAD WITHOUT CONTRAST  TECHNIQUE: Multiplanar, multiecho pulse sequences of the brain and surrounding structures were obtained without intravenous contrast.  COMPARISON:  CT head 11/28/2013.  MR head 07/14/2006.  FINDINGS: The patient was unable to remain motionless for the exam. Small or subtle lesions could be overlooked.  Chronic encephalomalacia related to old left MCA territory infarction affecting much of the left hemisphere including the frontal, temporal, and parietal lobes, including the basal ganglia. No acute stroke is evident. There is no acute hemorrhage, mass lesion, hydrocephalus, or extra-axial fluid. Generalized cerebral and cerebellar atrophy. Chronic microvascular ischemic change affects the periventricular and subcortical white matter. Prominent perivascular spaces suggest chronic hypertension.  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 Some flow void signal is seen in the left MCA trunk. This LICA vessel occlusion was not present in 2007.  Pituitary, pineal, and cerebellar tonsils unremarkable. No upper cervical lesions. There may be slight chronic hemorrhage associated with the old left basal ganglia infarct.  No osseous lesions are evident. Upper  cervical region unremarkable. Bilateral cataract extraction. No sinus or mastoid disease.  Compared with 2007, there has been progressive atrophy. The left MCA hemispheric infarctions were acute at that time. The left basal ganglia infarct was present before 2007.  IMPRESSION: No acute stroke or other acute intracranial findings.  Remote left MCA territory infarction.  Occluded left internal carotid artery has occurred since 2007. There appears to be collateral flow into the left anterior circulation based on flow void signal within the left MCA.   Electronically Signed   By: Davonna Belling M.D.   On: 11/29/2013 09:53   Dg Chest Port 1 View  11/28/2013   CLINICAL DATA:  Acute mental status changes.  EXAM: PORTABLE CHEST - 1 VIEW  COMPARISON:  None.  FINDINGS: The lung bases are not included on this study. Visualized portion of the  long bones demonstrate enlargement of the cardiac silhouette. There is prominence of the interstitial markings, indistinctness of the pulmonary vasculature, and peribronchial cuffing. The osseous structures demonstrate degenerative changes within the right lobe shoulders. No focal regions of consolidation appreciated.  IMPRESSION: Interstitial infiltrate likely reflecting pulmonary edema within the visualized portions of the chest.   Electronically Signed   By: Salome Holmes M.D.   On: 11/28/2013 15:40   Dg Swallowing Func-speech Pathology  12/04/2013   Breck Coons Spencer, CCC-SLP     12/04/2013  9:51 AM Objective Swallowing Evaluation: Modified Barium Swallowing Study   Patient Details  Name: William Wade MRN: [id number] Date of Birth: 1925/11/30  Today's Date: 12/04/2013 Time: 0900-0925 SLP Time Calculation (min): 25 min  Past Medical History:  Past Medical History  Diagnosis Date  . Stroke   . Dementia   . HTN (hypertension)    Past Surgical History:  Past Surgical History  Procedure Laterality Date  . Wrist surgery    . Prostate surgery     HPI:  79 y.o. male was brought to the ER  the patient was found on the  floor in his house. As per patient's son with whom I spoke over  the phone patient has not been doing well for last 3 days and has  been found to be having change in mental status has found the  patient's grandsons 3 days ago. As per patient's friend Mr.  Lynnea Ferrier patient had a fall yesterday. He was brought to the ER.  In the ER CT head did not show anything acute. Chest x-ray shows  congestion concerning for CHF.Patient does have history of  progressive dementia which has worsened after his wife's death  this 03-12-2023. Patient usually lives alone and drives. His finances  are managed by son. He does not cook. Patient has not wanted to  go to any physicians. His son has arranged house visiting  physicians.  Additional PMH includes CVA.  MRI revealed No acute  stroke or other acute intracranial findings.  Remote left MCA  territory infarction.  CXR 12/26 reveals Interval development of  bilateral effusions, Improvement in interstitial findings  consistent with decreased pulmonary edema,  Atelectasis versus  infiltrate within the lung bases. Focal density projects within  the left lung base with differential considerations of a focal  region of consolidation no a mass within this area cannot be  excluded. Continued surveillance evaluation is recommended.     Assessment / Plan / Recommendation Clinical Impression  Dysphagia Diagnosis: Moderate oral phase dysphagia;Severe  pharyngeal phase dysphagia;Moderate pharyngeal phase dysphagia Clinical impression: Pt. exhibits decreased oral cohesion with  lingual and sublingual residue and delayed transit for moderate  oral dysphagia.  Pharyngeal phase is moderate-severe sensorimotor  deficits resulting in laryngeal penetration and/or aspiration  (silent) with any consistency thinner than puree.  Swallow  initiation is delayed to the pyrifrom sinuses with mod-max  vallecular and pyriform sinus residue.  Pt. fatigued during  assessment, unable to  consistently swallow again to clear residue  and significant difficulty performing volitional cough further  increasing aspiration risk.  Son was not present for MBS.  RN  states plan is for SNF with Palliative Care services.  Family  needs education regarding risk of dehydration with pudding thick  liquids and honey thick liquids may be best choice with known  aspiration.  ST will continue for education with swallow  precuations.    Treatment Recommendation  Therapy as outlined in treatment plan below  Diet Recommendation Dysphagia 1 (Puree);Pudding-thick liquid   Liquid Administration via: Spoon Medication Administration: Crushed with puree Supervision: Patient able to self feed;Full supervision/cueing  for compensatory strategies Compensations: Small sips/bites;Multiple dry swallows after each  bite/sip;Clear throat intermittently;Hard cough after swallow Postural Changes and/or Swallow Maneuvers: Seated upright 90  degrees;Upright 30-60 min after meal    Other  Recommendations Oral Care Recommendations: Oral care BID   Follow Up Recommendations  Skilled Nursing facility    Frequency and Duration min 2x/week  2 weeks   Pertinent Vitals/Pain WDL            Reason for Referral Objectively evaluate swallowing function   Oral Phase Oral Preparation/Oral Phase Oral Phase: Impaired Oral - Honey Oral - Honey Cup: Delayed oral transit;Lingual/palatal residue  (sublingual residue, decreased cohesion) Oral - Nectar Oral - Nectar Cup: Delayed oral transit;Lingual/palatal residue  (sublingual residue, decreased cohesion) Oral - Solids Oral - Puree: Delayed oral transit   Pharyngeal Phase Pharyngeal Phase Pharyngeal Phase: Impaired Pharyngeal - Honey Pharyngeal - Honey Teaspoon: Penetration/Aspiration during  swallow;Pharyngeal residue - pyriform sinuses;Pharyngeal residue  - valleculae;Reduced tongue base retraction;Reduced  airway/laryngeal closure;Reduced laryngeal elevation;Delayed  swallow initiation;Premature spillage  to pyriform sinuses Penetration/Aspiration details (honey teaspoon): Material enters  airway, remains ABOVE vocal cords and not ejected out Pharyngeal - Honey Cup: Pharyngeal residue - pyriform  sinuses;Pharyngeal residue - valleculae;Penetration/Aspiration  during swallow;Reduced tongue base retraction;Reduced  airway/laryngeal closure;Reduced laryngeal elevation;Reduced  anterior laryngeal mobility Penetration/Aspiration details (honey cup): Material enters  airway, CONTACTS cords and not ejected out Pharyngeal - Nectar Pharyngeal - Nectar Teaspoon: Delayed swallow  initiation;Premature spillage to pyriform sinuses;Pharyngeal  residue - posterior pharnyx;Pharyngeal residue -  valleculae;Penetration/Aspiration during swallow;Reduced tongue  base retraction;Reduced airway/laryngeal closure;Reduced  laryngeal elevation Penetration/Aspiration details (nectar teaspoon): Material enters  airway, remains ABOVE vocal cords and not ejected out;Material  enters airway, CONTACTS cords and not ejected out Pharyngeal - Nectar Cup: Delayed swallow initiation;Premature  spillage to pyriform sinuses;Pharyngeal residue -  valleculae;Pharyngeal residue - pyriform  sinuses;Penetration/Aspiration during swallow;Reduced tongue base  retraction;Reduced airway/laryngeal closure;Reduced laryngeal  elevation;Trace aspiration Penetration/Aspiration details (nectar cup): Material enters  airway, passes BELOW cords without attempt by patient to eject  out (silent aspiration) Pharyngeal - Solids Pharyngeal - Puree: Delayed swallow initiation;Premature spillage  to pyriform sinuses;Pharyngeal residue - valleculae;Reduced  laryngeal elevation;Reduced airway/laryngeal closure;Reduced  tongue base retraction;Pharyngeal residue - pyriform  sinuses;Reduced anterior laryngeal mobility  Cervical Esophageal Phase    GO    Cervical Esophageal Phase Cervical Esophageal Phase: [facility]         Darrow Bussing.Ed CCC-SLP Pager [phone]  12/04/2013     Current Medications:  . aspirin EC  81 mg Oral Daily  . enalapril  5 mg Oral Daily  . enoxaparin (LOVENOX) injection  1 mg/kg Subcutaneous Q12H  . feeding supplement (ENSURE COMPLETE)  237 mL Oral TID BM  . furosemide  80 mg Intravenous BID  . levofloxacin  750 mg Oral Q48H  . sodium chloride  3 mL Intravenous Q12H  . sodium chloride  3 mL Intravenous Q12H  . spironolactone  25 mg Oral Daily  . Warfarin - Pharmacist Dosing Inpatient   Does not apply q1800    ASSESSMENT AND PLAN:  1. Acute systolic CHF- EF 45-40% this admission. Deferred ischemic work-up and aggressive management. DNR/DNI. Palliative consulting. Plan for SNF. Remains on Lasix 80mg  IV BID, metolazone 2.5mg   Total I/O - 10L, weight 153 lbs today. BUN/Cr up at 36/1.53. Received a dose a metolazone  12/27. On ACEi, spironolactone as well. Appears compensated on exam today.  -- Hold Lasix today, switch to 60mg  PO BID 12/30 -- Continue to monitor renal function, electrolytes with serial BMETs -- Continue ACEi, spironolactone -- Consider starting mortality-benefiting beta blocker. Nebivolol may be one option given age, but concern for BP effect. BP 100/46 this AM. Will discuss further with MD.  2. Acute on CKD, stage III- Cr bump from 1.29->1.53 yesterday. Received Lasix 80mg  IV x 1 this AM. Metolazone given 12/27. -- Hold Lasix today, convert to PO tomorrow -- Would monitor renal function, electrolytes for another day -- If improved by tomorrow, okay for d/c to SNF. Will discuss further with MD.   3. Intermittent delirium- on a background of dementia. Question sun-downing. Primary team following.  4. LLE DVT- enoxaparin bridge to warfarin. INR remains subtherapeutic today at 1.37.  5. Physical deconditioning- SNF.   6. Hypokalemia- resolved.   7. Protein calorie malnutrition- protein supplementation per RD recommendations. Primary team managing.   Dispo- palliative on board, plan for SNF today per primary team.  DNR/DNI. Comfort measures pursued. Okay to discharge today. Lasix PO to start tomorrow as above, Lovenox bridge to Coumadin for DVT. Primary team coordinating.    Signed, R. Hurman Horn, PA-C 12/04/2013, 9:54 AM  Patient seen with PA, agree with the above note.  Patient is not very communicative but relative is present.   1. Acute systolic CHF: Denies dyspnea.  He has lost about 28 lbs, good diuresis yesterday.  He does not look volume overloaded today and creatinine is higher.   - Hold Lasix today.   - Restart Lasix tomorrow at 40 mg po bid.  - Can continue low dose enalapril at 5 mg daily and spironolactone, but will need to follow BMET closely.  Will have CHF clinic followup with BMET next week.  2. Atrial flutter: Apparently new today.  HR in 90s, was in 90s also when in NSR.  He is on coumadin for DVT already.  Would add Toprol XL 25 mg daily.  3. Acute DVT: He is on Lovenox to coumadin bridge.  4. Patient's power of attorney is not present, but from what I can glean from notes, the plan is for him to go to SNF potentially today.  He is not going to be under hospice care but palliative care will see him twice a month.  The goal will be comfort but he may return to hospital if needed.  I would suggest further discussion as time goes by to transition him to full hospice care.   Marca Ancona 12/04/2013 2:42 PM

## 2013-12-04 NOTE — Procedures (Signed)
Objective Swallowing Evaluation: Modified Barium Swallowing Study  Patient Details  Name: William Wade MRN: [id number] Date of Birth: 09-13-1925  Today's Date: 12/04/2013 Time: 0900-0925 SLP Time Calculation (min): 25 min  Past Medical History:  Past Medical History  Diagnosis Date  . Stroke   . Dementia   . HTN (hypertension)    Past Surgical History:  Past Surgical History  Procedure Laterality Date  . Wrist surgery    . Prostate surgery     HPI:  79 y.o. male was brought to the ER the patient was found on the floor in his house. As per patient's son with whom I spoke over the phone patient has not been doing well for last 3 days and has been found to be having change in mental status has found the patient's grandsons 3 days ago. As per patient's friend Mr. Lynnea Ferrier patient had a fall yesterday. He was brought to the ER. In the ER CT head did not show anything acute. Chest x-ray shows congestion concerning for CHF.Patient does have history of progressive dementia which has worsened after his wife's death this 2023/02/27. Patient usually lives alone and drives. His finances are managed by son. He does not cook. Patient has not wanted to go to any physicians. His son has arranged house visiting physicians.  Additional PMH includes CVA.  MRI revealed No acute stroke or other acute intracranial findings.  Remote left MCA territory infarction.  CXR 12/26 reveals Interval development of bilateral effusions, Improvement in interstitial findings consistent with decreased pulmonary edema,  Atelectasis versus infiltrate within the lung bases. Focal density projects within the left lung base with differential considerations of a focal region of consolidation no a mass within this area cannot be excluded. Continued surveillance evaluation is recommended.     Assessment / Plan / Recommendation Clinical Impression  Dysphagia Diagnosis: Moderate oral phase dysphagia;Severe pharyngeal phase dysphagia;Moderate  pharyngeal phase dysphagia Clinical impression: Pt. exhibits decreased oral cohesion with lingual and sublingual residue and delayed transit for moderate oral dysphagia.  Pharyngeal phase is moderate-severe sensorimotor deficits resulting in laryngeal penetration and/or aspiration (silent) with any consistency thinner than puree.  Swallow initiation is delayed to the pyrifrom sinuses with mod-max vallecular and pyriform sinus residue.  Pt. fatigued during assessment, unable to consistently swallow again to clear residue and significant difficulty performing volitional cough further increasing aspiration risk.  Son was not present for MBS.  RN states plan is for SNF with Palliative Care services.  Family needs education regarding risk of dehydration with pudding thick liquids and honey thick liquids may be best choice with known aspiration.  ST will continue for education with swallow precuations.    Treatment Recommendation  Therapy as outlined in treatment plan below    Diet Recommendation Dysphagia 1 (Puree);Pudding-thick liquid   Liquid Administration via: Spoon Medication Administration: Crushed with puree Supervision: Patient able to self feed;Full supervision/cueing for compensatory strategies Compensations: Small sips/bites;Multiple dry swallows after each bite/sip;Clear throat intermittently;Hard cough after swallow Postural Changes and/or Swallow Maneuvers: Seated upright 90 degrees;Upright 30-60 min after meal    Other  Recommendations Oral Care Recommendations: Oral care BID   Follow Up Recommendations  Skilled Nursing facility    Frequency and Duration min 2x/week  2 weeks   Pertinent Vitals/Pain WDL            Reason for Referral Objectively evaluate swallowing function   Oral Phase Oral Preparation/Oral Phase Oral Phase: Impaired Oral - Honey Oral - Honey Cup: Delayed oral transit;Lingual/palatal  residue (sublingual residue, decreased cohesion) Oral - Nectar Oral -  Nectar Cup: Delayed oral transit;Lingual/palatal residue (sublingual residue, decreased cohesion) Oral - Solids Oral - Puree: Delayed oral transit   Pharyngeal Phase Pharyngeal Phase Pharyngeal Phase: Impaired Pharyngeal - Honey Pharyngeal - Honey Teaspoon: Penetration/Aspiration during swallow;Pharyngeal residue - pyriform sinuses;Pharyngeal residue - valleculae;Reduced tongue base retraction;Reduced airway/laryngeal closure;Reduced laryngeal elevation;Delayed swallow initiation;Premature spillage to pyriform sinuses Penetration/Aspiration details (honey teaspoon): Material enters airway, remains ABOVE vocal cords and not ejected out Pharyngeal - Honey Cup: Pharyngeal residue - pyriform sinuses;Pharyngeal residue - valleculae;Penetration/Aspiration during swallow;Reduced tongue base retraction;Reduced airway/laryngeal closure;Reduced laryngeal elevation;Reduced anterior laryngeal mobility Penetration/Aspiration details (honey cup): Material enters airway, CONTACTS cords and not ejected out Pharyngeal - Nectar Pharyngeal - Nectar Teaspoon: Delayed swallow initiation;Premature spillage to pyriform sinuses;Pharyngeal residue - posterior pharnyx;Pharyngeal residue - valleculae;Penetration/Aspiration during swallow;Reduced tongue base retraction;Reduced airway/laryngeal closure;Reduced laryngeal elevation Penetration/Aspiration details (nectar teaspoon): Material enters airway, remains ABOVE vocal cords and not ejected out;Material enters airway, CONTACTS cords and not ejected out Pharyngeal - Nectar Cup: Delayed swallow initiation;Premature spillage to pyriform sinuses;Pharyngeal residue - valleculae;Pharyngeal residue - pyriform sinuses;Penetration/Aspiration during swallow;Reduced tongue base retraction;Reduced airway/laryngeal closure;Reduced laryngeal elevation;Trace aspiration Penetration/Aspiration details (nectar cup): Material enters airway, passes BELOW cords without attempt by patient to eject  out (silent aspiration) Pharyngeal - Solids Pharyngeal - Puree: Delayed swallow initiation;Premature spillage to pyriform sinuses;Pharyngeal residue - valleculae;Reduced laryngeal elevation;Reduced airway/laryngeal closure;Reduced tongue base retraction;Pharyngeal residue - pyriform sinuses;Reduced anterior laryngeal mobility  Cervical Esophageal Phase    GO    Cervical Esophageal Phase Cervical Esophageal Phase: [facility]         Darrow Bussing.Ed [employer] [phone]  12/04/2013

## 2013-12-04 NOTE — Discharge Summary (Addendum)
Physician Discharge Summary  William Wade WUJ:[id number] DOB: [birth year] DOA: 11/28/2013  PCP: No primary provider on file.  Admit date: 11/28/2013 Discharge date: 12/04/2013  Time spent: 50* minutes  Recommendations for Outpatient Follow-up:  1. *Follow up Nursing home physician 2. Check PT/INR and BMP in 3 days 3. Continue with Lovenox full dose until INR comes more than 2.0  Discharge Diagnoses:  Active Problems:   Acute encephalopathy   CHF (congestive heart failure)   Hypokalemia   Anemia   History of CVA (cerebrovascular accident)   Acute systolic heart failure   PNA (pneumonia)   Discharge Condition: Stable  Diet recommendation: Dysphagia 1 diet with pudding thick liquid  Filed Weights   12/02/13 0522 12/03/13 0500 12/04/13 0300  Weight: 76.476 kg (168 lb 9.6 oz) 73.6 kg (162 lb 4.1 oz) 69.7 kg (153 lb 10.6 oz)    History of present illness:  79 y.o. male was brought to the ER the patient was found on the floor in his house. As per patient's son with whom I spoke over the phone patient has not been doing well for last 3 days and has been found to be having change in mental status has found the patient's grandsons 3 days ago. As per patient's friend Mr. Lynnea Ferrier patient had a fall yesterday. And today was found on the floor and found weak. He was brought to the ER. In the ER CT head did not show anything acute. Chest x-ray shows congestion concerning for CHF. Patient is on exam found to be mildly short of breath. He is oriented to his name and follows commands and nonfocal. Patient denies any chest pain abdominal pain nausea vomiting diarrhea. Patient does have history of progressive dementia which has worsened after his wife's death this 2023/03/10. Patient usually lives alone and drives. His finances are managed by son. He does not cook. Patient has not wanted to go to any physicians. His son has arranged house visiting physicians.   Hospital Course:  Acute encephalopathy in a  patient with dementia ; patient came with encephalopathy, MRI negative for stroke. UA negative. Chest x ray; pulmonary edema. TSH normal at 1.1, at this time patient is back to his baseline. Encephalopathy is likely due to worsening dementia. B-12 at 1040, ammonia at 10.   2-Acute Systolic Heart Failure; patient was seen by cardiology, had mild increase of troponin and echo showed EF of 35-30%. Cardiology started the patient on Lasix 80 mg IV BID, at this time patient will be discharged on Lasix 60 mg twice a day to be started from 12/05/2013.  Weight 83 ---69 kg   3-Hypokalemia; resolved.   4-Anemia - as per patient's son patient has chronic anemia. Per the notes reviewed in the chart patient has previously refused a colonoscopy. Hb stable. Probably iron deficiency anemia.   5-Previous history of CVA - MRI negative for acute stroke.   6-left leg is positive for a non-occlusive short segment of deep vein thrombosis in the distal common femoral vein; patient was diagnosed with DVT in the distal common femoral vein and was started on Lovenox along with Coumadin. At this time INR is 1.37 Continue with Lovenox , along with Coumadin. Follow PT/INR in 3 days, and once the INR is greater than 2 Lovenox can be discontinued.  7-Cough: Patient had cough which was secondary to pulmonary edema. It has improved with diuresis  8. Palliative care discussion- patient had discussion with palate of care and most form has been filled out,  patient will be followed by palliative care services at the skilled facility  9.? Pneumonia- there was? Pneumonia on chest x-ray patient was started on Levaquin, will continue Levaquin for 5 more days. Stop date on 1/2/ 2015  Procedures:  2-D echo  Lower extremity venous duplex  Consultations:  Cardiology  Discharge Exam: Filed Vitals:   12/04/13 1028  BP: 119/58  Pulse: 89  Temp:   Resp:     General: Appears in no acute distress Cardiovascular: S1-S2  regular Respiratory: Clear bilaterally  Discharge Instructions  Discharge Orders   Future Orders Complete By Expires   Discharge instructions  As directed    Comments:     Dysphagia 1 diet with pudding thick liquid       Medication List         enalapril 5 MG tablet  Commonly known as:  VASOTEC  Take 1 tablet (5 mg total) by mouth daily.     enoxaparin 80 MG/0.8ML injection  Commonly known as:  LOVENOX  Inject 0.7 mLs (70 mg total) into the skin every 12 (twelve) hours.     feeding supplement (ENSURE COMPLETE) Liqd  Take 237 mLs by mouth 3 (three) times daily between meals.     food thickener Powd  Commonly known as:  THICK IT  Pudding thick liquids     spironolactone 25 MG tablet  Commonly known as:  ALDACTONE  Take 1 tablet (25 mg total) by mouth daily.     warfarin 7.5 MG tablet  Commonly known as:  COUMADIN  Take 1 tablet (7.5 mg total) by mouth daily at 6 PM.         Lasix 40 mg po BID    Toprol XL 25  Mg po daily   The results of significant diagnostics from this hospitalization (including imaging, microbiology, ancillary and laboratory) are listed below for reference.    Significant Diagnostic Studies: Dg Chest 2 View  12/01/2013   CLINICAL DATA:  Cough, shortness of breath  EXAM: CHEST  2 VIEW  COMPARISON:  11/28/2013  FINDINGS: The lower borders and cardiac silhouette are obscured. Visualized portions appear enlarged. Small bilateral pleural effusions are appreciated which appear to have developed in the interim. There is decreased conspicuity of the interstitial markings. Mild residual prominence is appreciated. There are areas of mild increased density within the lung bases with a focal area medially on the right. The osseous structures unremarkable.  IMPRESSION: 1. Interval development of bilateral effusions 2. Improvement in interstitial findings consistent with decreased pulmonary edema 3. Atelectasis versus infiltrate within the lung bases. Focal  density projects within the left lung base with differential considerations of a focal region of consolidation no a mass within this area cannot be excluded. Continued surveillance evaluation is recommended.   Electronically Signed   By: Salome Holmes M.D.   On: 12/01/2013 14:53   Ct Head Wo Contrast  11/28/2013   CLINICAL DATA:  Altered mental status, history of stroke  EXAM: CT HEAD WITHOUT CONTRAST  TECHNIQUE: Contiguous axial images were obtained from the base of the skull through the vertex without intravenous contrast.  COMPARISON:  MRI brain dated 07/14/2006  FINDINGS: No evidence of parenchymal hemorrhage or extra-axial fluid collection. No mass lesion, mass effect, or midline shift.  No CT evidence of acute infarction. Encephalomalacic changes related to prior left MCA distribution infarct.  Subcortical white matter and periventricular small vessel ischemic changes. Intracranial atherosclerosis.  Global cortical atrophy.  No ventriculomegaly.  The visualized paranasal  sinuses are essentially clear. The mastoid air cells are unopacified.  No evidence of calvarial fracture.  IMPRESSION: No evidence of acute intracranial abnormality.  Encephalomalacic changes related to prior left MCA distribution infarct.  Atrophy with small vessel ischemic changes and intracranial atherosclerosis.   Electronically Signed   By: Charline Bills M.D.   On: 11/28/2013 19:26   Mr Brain Wo Contrast  11/29/2013   CLINICAL DATA:  Altered mental status. Acute encephalopathy. Dementia. Evaluate for stroke.  EXAM: MRI HEAD WITHOUT CONTRAST  TECHNIQUE: Multiplanar, multiecho pulse sequences of the brain and surrounding structures were obtained without intravenous contrast.  COMPARISON:  CT head 11/28/2013.  MR head 07/14/2006.  FINDINGS: The patient was unable to remain motionless for the exam. Small or subtle lesions could be overlooked.  Chronic encephalomalacia related to old left MCA territory infarction affecting much of  the left hemisphere including the frontal, temporal, and parietal lobes, including the basal ganglia. No acute stroke is evident. There is no acute hemorrhage, mass lesion, hydrocephalus, or extra-axial fluid. Generalized cerebral and cerebellar atrophy. Chronic microvascular ischemic change affects the periventricular and subcortical white matter. Prominent perivascular spaces suggest chronic hypertension.  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 Some flow void signal is seen in the left MCA trunk. This LICA vessel occlusion was not present in 2007.  Pituitary, pineal, and cerebellar tonsils unremarkable. No upper cervical lesions. There may be slight chronic hemorrhage associated with the old left basal ganglia infarct.  No osseous lesions are evident. Upper cervical region unremarkable. Bilateral cataract extraction. No sinus or mastoid disease.  Compared with 2007, there has been progressive atrophy. The left MCA hemispheric infarctions were acute at that time. The left basal ganglia infarct was present before 2007.  IMPRESSION: No acute stroke or other acute intracranial findings.  Remote left MCA territory infarction.  Occluded left internal carotid artery has occurred since 2007. There appears to be collateral flow into the left anterior circulation based on flow void signal within the left MCA.   Electronically Signed   By: Davonna Belling M.D.   On: 11/29/2013 09:53   Dg Chest Port 1 View  11/28/2013   CLINICAL DATA:  Acute mental status changes.  EXAM: PORTABLE CHEST - 1 VIEW  COMPARISON:  None.  FINDINGS: The lung bases are not included on this study. Visualized portion of the long bones demonstrate enlargement of the cardiac silhouette. There is prominence of the interstitial markings, indistinctness of the pulmonary vasculature, and peribronchial cuffing. The osseous  structures demonstrate degenerative changes within the right lobe shoulders. No focal regions of consolidation appreciated.  IMPRESSION: Interstitial infiltrate likely reflecting pulmonary edema within the visualized portions of the chest.   Electronically Signed   By: Salome Holmes M.D.   On: 11/28/2013 15:40   Dg Swallowing Func-speech Pathology  12/04/2013   Breck Coons [place], CCC-SLP     12/04/2013  9:51 AM Objective Swallowing Evaluation: Modified Barium Swallowing Study   Patient Details  Name: Kylil Swopes MRN: [id number] Date of Birth: [birth year]  Today's Date: 12/04/2013 Time: 0900-0925 SLP Time Calculation (min): 25 min  Past Medical History:  Past Medical History  Diagnosis Date  . Stroke   . Dementia   . HTN (hypertension)    Past Surgical History:  Past Surgical History  Procedure Laterality Date  . Wrist surgery    . Prostate surgery  HPI:  79 y.o. male was brought to the ER the patient was found on the  floor in his house. As per patient's son with whom I spoke over  the phone patient has not been doing well for last 3 days and has  been found to be having change in mental status has found the  patient's grandsons 3 days ago. As per patient's friend Mr.  Lynnea Ferrier patient had a fall yesterday. He was brought to the ER.  In the ER CT head did not show anything acute. Chest x-ray shows  congestion concerning for CHF.Patient does have history of  progressive dementia which has worsened after his wife's death  this Mar 06, 2023. Patient usually lives alone and drives. His finances  are managed by son. He does not cook. Patient has not wanted to  go to any physicians. His son has arranged house visiting  physicians.  Additional PMH includes CVA.  MRI revealed No acute  stroke or other acute intracranial findings.  Remote left MCA  territory infarction.  CXR 12/26 reveals Interval development of  bilateral effusions, Improvement in interstitial findings  consistent with decreased pulmonary edema,  Atelectasis  versus  infiltrate within the lung bases. Focal density projects within  the left lung base with differential considerations of a focal  region of consolidation no a mass within this area cannot be  excluded. Continued surveillance evaluation is recommended.     Assessment / Plan / Recommendation Clinical Impression  Dysphagia Diagnosis: Moderate oral phase dysphagia;Severe  pharyngeal phase dysphagia;Moderate pharyngeal phase dysphagia Clinical impression: Pt. exhibits decreased oral cohesion with  lingual and sublingual residue and delayed transit for moderate  oral dysphagia.  Pharyngeal phase is moderate-severe sensorimotor  deficits resulting in laryngeal penetration and/or aspiration  (silent) with any consistency thinner than puree.  Swallow  initiation is delayed to the pyrifrom sinuses with mod-max  vallecular and pyriform sinus residue.  Pt. fatigued during  assessment, unable to consistently swallow again to clear residue  and significant difficulty performing volitional cough further  increasing aspiration risk.  Son was not present for MBS.  RN  states plan is for SNF with Palliative Care services.  Family  needs education regarding risk of dehydration with pudding thick  liquids and honey thick liquids may be best choice with known  aspiration.  ST will continue for education with swallow  precuations.    Treatment Recommendation  Therapy as outlined in treatment plan below    Diet Recommendation Dysphagia 1 (Puree);Pudding-thick liquid   Liquid Administration via: Spoon Medication Administration: Crushed with puree Supervision: Patient able to self feed;Full supervision/cueing  for compensatory strategies Compensations: Small sips/bites;Multiple dry swallows after each  bite/sip;Clear throat intermittently;Hard cough after swallow Postural Changes and/or Swallow Maneuvers: Seated upright 90  degrees;Upright 30-60 min after meal    Other  Recommendations Oral Care Recommendations: Oral care BID    Follow Up Recommendations  Skilled Nursing facility    Frequency and Duration min 2x/week  2 weeks   Pertinent Vitals/Pain WDL            Reason for Referral Objectively evaluate swallowing function   Oral Phase Oral Preparation/Oral Phase Oral Phase: Impaired Oral - Honey Oral - Honey Cup: Delayed oral transit;Lingual/palatal residue  (sublingual residue, decreased cohesion) Oral - Nectar Oral - Nectar Cup: Delayed oral transit;Lingual/palatal residue  (sublingual residue, decreased cohesion) Oral - Solids Oral - Puree: Delayed oral transit   Pharyngeal Phase Pharyngeal Phase Pharyngeal Phase: Impaired Pharyngeal - Honey Pharyngeal -  Honey Teaspoon: Penetration/Aspiration during  swallow;Pharyngeal residue - pyriform sinuses;Pharyngeal residue  - valleculae;Reduced tongue base retraction;Reduced  airway/laryngeal closure;Reduced laryngeal elevation;Delayed  swallow initiation;Premature spillage to pyriform sinuses Penetration/Aspiration details (honey teaspoon): Material enters  airway, remains ABOVE vocal cords and not ejected out Pharyngeal - Honey Cup: Pharyngeal residue - pyriform  sinuses;Pharyngeal residue - valleculae;Penetration/Aspiration  during swallow;Reduced tongue base retraction;Reduced  airway/laryngeal closure;Reduced laryngeal elevation;Reduced  anterior laryngeal mobility Penetration/Aspiration details (honey cup): Material enters  airway, CONTACTS cords and not ejected out Pharyngeal - Nectar Pharyngeal - Nectar Teaspoon: Delayed swallow  initiation;Premature spillage to pyriform sinuses;Pharyngeal  residue - posterior pharnyx;Pharyngeal residue -  valleculae;Penetration/Aspiration during swallow;Reduced tongue  base retraction;Reduced airway/laryngeal closure;Reduced  laryngeal elevation Penetration/Aspiration details (nectar teaspoon): Material enters  airway, remains ABOVE vocal cords and not ejected out;Material  enters airway, CONTACTS cords and not ejected out Pharyngeal - Nectar Cup:  Delayed swallow initiation;Premature  spillage to pyriform sinuses;Pharyngeal residue -  valleculae;Pharyngeal residue - pyriform  sinuses;Penetration/Aspiration during swallow;Reduced tongue base  retraction;Reduced airway/laryngeal closure;Reduced laryngeal  elevation;Trace aspiration Penetration/Aspiration details (nectar cup): Material enters  airway, passes BELOW cords without attempt by patient to eject  out (silent aspiration) Pharyngeal - Solids Pharyngeal - Puree: Delayed swallow initiation;Premature spillage  to pyriform sinuses;Pharyngeal residue - valleculae;Reduced  laryngeal elevation;Reduced airway/laryngeal closure;Reduced  tongue base retraction;Pharyngeal residue - pyriform  sinuses;Reduced anterior laryngeal mobility  Cervical Esophageal Phase    GO    Cervical Esophageal Phase Cervical Esophageal Phase: [facility]         Darrow [email or web address].Ed CCC-SLP Pager [phone]  12/04/2013    Microbiology: No results found for this or any previous visit (from the past 240 hour(s)).   Labs: Basic Metabolic Panel:  Recent Labs Lab 11/30/13 0526 12/01/13 0530 12/02/13 0530 12/03/13 0330 12/04/13 0448  NA 143 143 141 136 136  K 4.3 4.5 5.3* 4.1 4.0  CL 105 105 99 92* 90*  CO2 26 29 35* 38* 39*  GLUCOSE 202* 120* 106* 103* 190*  BUN 37* 35* 32* 32* 36*  CREATININE 1.14 1.31 1.25 1.29 1.53*  CALCIUM 8.6 8.2* 8.8 8.6 8.5   Liver Function Tests:  Recent Labs Lab 11/28/13 1446 11/29/13 0450  AST 32 27  ALT 24 21  ALKPHOS 65 59  BILITOT 1.2 1.2  PROT 6.1 5.8*  ALBUMIN 2.9* 2.7*   No results found for this basename: LIPASE, AMYLASE,  in the last 168 hours  Recent Labs Lab 11/28/13 2300  AMMONIA 10*   CBC:  Recent Labs Lab 11/28/13 1446 11/28/13 2300 11/29/13 0450 11/30/13 0526 12/03/13 0330  WBC 11.3* 8.8 8.2 13.1* 7.1  NEUTROABS 9.4*  --  5.3  --   --   HGB 10.8* 9.9* 10.2* 11.2* 9.7*  HCT 32.3* 30.5* 31.1* 34.5* 30.4*  MCV 97.6 97.8 98.4 98.9 99.3  PLT 185 192  191 193 180   Cardiac Enzymes:  Recent Labs Lab 11/28/13 1446 11/28/13 2300 11/29/13 0450 11/29/13 0945  TROPONINI <0.30 0.70* 0.46* <0.30   BNP: BNP (last 3 results)  Recent Labs  11/28/13 1446  PROBNP 12187.0*   CBG: No results found for this basename: GLUCAP,  in the last 168 hours     Signed:  Nargis Abrams S  [facility] 12/04/2013, 12:30 PM

## 2013-12-05 ENCOUNTER — Non-Acute Institutional Stay (SKILLED_NURSING_FACILITY): Payer: Medicare Other | Admitting: Nurse Practitioner

## 2013-12-05 DIAGNOSIS — R5381 Other malaise: Secondary | ICD-10-CM

## 2013-12-05 DIAGNOSIS — Z8673 Personal history of transient ischemic attack (TIA), and cerebral infarction without residual deficits: Secondary | ICD-10-CM

## 2013-12-05 DIAGNOSIS — D649 Anemia, unspecified: Secondary | ICD-10-CM

## 2013-12-05 DIAGNOSIS — J189 Pneumonia, unspecified organism: Secondary | ICD-10-CM

## 2013-12-05 DIAGNOSIS — I10 Essential (primary) hypertension: Secondary | ICD-10-CM

## 2013-12-05 DIAGNOSIS — I509 Heart failure, unspecified: Secondary | ICD-10-CM

## 2013-12-05 DIAGNOSIS — I82402 Acute embolism and thrombosis of unspecified deep veins of left lower extremity: Secondary | ICD-10-CM

## 2013-12-05 DIAGNOSIS — R413 Other amnesia: Secondary | ICD-10-CM

## 2013-12-05 DIAGNOSIS — I82409 Acute embolism and thrombosis of unspecified deep veins of unspecified lower extremity: Secondary | ICD-10-CM

## 2013-12-05 NOTE — Progress Notes (Signed)
Patient ID: William Wade, male   DOB: [birth year], 79 y.o.   MRN: [id number]    Nursing Home Location:  [facility]   Place of Service: SNF (31)  PCP: No primary provider on file.  No Known Allergies  Chief Complaint  Patient presents with  . Hospitalization Follow-up    HPI:  History of present illness:   79 y.o. male was brought to the ER the patient was found on the floor in his house with progressive weakness; in the ER CT head did not show anything acute. Chest x-ray shows congestion concerning for CHF. Patient does have history of progressive dementia which has worsened after his wife's death this 02/19/2023.  Pt was treated in the hospital for cute encephalopathy in a patient with dementia, CHF, pneumonia, new DVT and is now at [facility] for ongoing rehab for strength training and nutritional support Pt has ongoing confusion and is unable to give reliable ROS or HPI Review of Systems:  Review of Systems  Constitutional: Negative for fever, chills and malaise/fatigue.  Respiratory: Negative for cough and shortness of breath.   Cardiovascular: Negative for chest pain.  Gastrointestinal: Negative for heartburn, abdominal pain, diarrhea and constipation.  Genitourinary: Negative for dysuria, urgency and frequency.  Musculoskeletal: Negative for myalgias.  Skin: Negative.   Neurological: Positive for weakness. Negative for dizziness and headaches.  Psychiatric/Behavioral: Positive for memory loss. Negative for depression. The patient is not nervous/anxious.      Past Medical History  Diagnosis Date  . Stroke   . Dementia   . HTN (hypertension)    Past Surgical History  Procedure Laterality Date  . Wrist surgery    . Prostate surgery     Social History:   reports that he has quit smoking. He does not have any smokeless tobacco history on file. He reports that he does not drink alcohol or use illicit drugs.  Family History  Problem Relation Age of Onset  . Lung  cancer Father     Medications: Patient's Medications  New Prescriptions   No medications on file  Previous Medications   ENALAPRIL (VASOTEC) 5 MG TABLET    Take 1 tablet (5 mg total) by mouth daily.   ENOXAPARIN (LOVENOX) 80 MG/0.8ML INJECTION    Inject 0.7 mLs (70 mg total) into the skin every 12 (twelve) hours.   FEEDING SUPPLEMENT, ENSURE COMPLETE, (ENSURE COMPLETE) LIQD    Take 237 mLs by mouth 3 (three) times daily between meals.   FOOD THICKENER (THICK IT) POWD    Pudding thick liquids   FUROSEMIDE (LASIX) 40 MG TABLET    Take 1 tablet (40 mg total) by mouth 2 (two) times daily.   LEVOFLOXACIN (LEVAQUIN) 750 MG TABLET    Take 1 tablet (750 mg total) by mouth every other day.   METOPROLOL SUCCINATE (TOPROL XL) 25 MG 24 HR TABLET    Take 1 tablet (25 mg total) by mouth daily.   SPIRONOLACTONE (ALDACTONE) 25 MG TABLET    Take 1 tablet (25 mg total) by mouth daily.   WARFARIN (COUMADIN) 7.5 MG TABLET    Take 1 tablet (7.5 mg total) by mouth daily at 6 PM.  Modified Medications   No medications on file  Discontinued Medications   No medications on file     Physical Exam: Physical Exam  Constitutional:  Thin male in NAD  HENT:  Head: Normocephalic and atraumatic.  Right Ear: External ear normal.  Left Ear: External ear normal.  Nose:  Nose normal.  Mouth/Throat: Oropharynx is clear and moist. No oropharyngeal exudate.  Eyes: Conjunctivae and EOM are normal. Pupils are equal, round, and reactive to light.  Neck: Normal range of motion. Neck supple. No thyromegaly present.  Cardiovascular: Normal rate, regular rhythm and normal heart sounds.   Pulmonary/Chest: Effort normal and breath sounds normal. No respiratory distress.  Abdominal: Soft. Bowel sounds are normal. He exhibits no distension.  Musculoskeletal: Normal range of motion. He exhibits no edema and no tenderness.  Lymphadenopathy:    He has no cervical adenopathy.  Neurological: He is alert.  Skin: Skin is warm and  dry. He is not diaphoretic.     Filed Vitals:   12/05/13 1238  BP: 100/58  Pulse: 80  Temp: 96.9 F (36.1 C)  Resp: 20      Labs reviewed: Basic Metabolic Panel:  Recent Labs  52/84/13 0530 12/03/13 0330 12/04/13 0448  NA 141 136 136  K 5.3* 4.1 4.0  CL 99 92* 90*  CO2 35* 38* 39*  GLUCOSE 106* 103* 190*  BUN 32* 32* 36*  CREATININE 1.25 1.29 1.53*  CALCIUM 8.8 8.6 8.5   Liver Function Tests:  Recent Labs  11/28/13 1446 11/29/13 0450  AST 32 27  ALT 24 21  ALKPHOS 65 59  BILITOT 1.2 1.2  PROT 6.1 5.8*  ALBUMIN 2.9* 2.7*   No results found for this basename: LIPASE, AMYLASE,  in the last 8760 hours  Recent Labs  11/28/13 2300  AMMONIA 10*   CBC:  Recent Labs  11/28/13 1446  11/29/13 0450 11/30/13 0526 12/03/13 0330  WBC 11.3*  < > 8.2 13.1* 7.1  NEUTROABS 9.4*  --  5.3  --   --   HGB 10.8*  < > 10.2* 11.2* 9.7*  HCT 32.3*  < > 31.1* 34.5* 30.4*  MCV 97.6  < > 98.4 98.9 99.3  PLT 185  < > 191 193 180  < > = values in this interval not displayed. Cardiac Enzymes:  Recent Labs  11/28/13 2300 11/29/13 0450 11/29/13 0945  TROPONINI 0.70* 0.46* <0.30   BNP: No components found with this basename: POCBNP,  CBG: No results found for this basename: GLUCAP,  in the last 8760 hours TSH:  Recent Labs  11/28/13 2300  TSH 1.137   A1C: No results found for this basename: HGBA1C   Lipid Panel: No results found for this basename: CHOL, HDL, LDLCALC, TRIG, CHOLHDL, LDLDIRECT,  in the last 8760 hours  Radiological Exams: Significant Diagnostic Studies: Dg Chest 2 View  12/01/2013   CLINICAL DATA:  Cough, shortness of breath  EXAM: CHEST  2 VIEW  COMPARISON:  11/28/2013  FINDINGS: The lower borders and cardiac silhouette are obscured. Visualized portions appear enlarged. Small bilateral pleural effusions are appreciated which appear to have developed in the interim. There is decreased conspicuity of the interstitial markings. Mild residual  prominence is appreciated. There are areas of mild increased density within the lung bases with a focal area medially on the right. The osseous structures unremarkable.  IMPRESSION: 1. Interval development of bilateral effusions 2. Improvement in interstitial findings consistent with decreased pulmonary edema 3. Atelectasis versus infiltrate within the lung bases. Focal density projects within the left lung base with differential considerations of a focal region of consolidation no a mass within this area cannot be excluded. Continued surveillance evaluation is recommended.   Electronically Signed   By: Salome Holmes M.D.   On: 12/01/2013 14:53   Ct Head Wo  Contrast  11/28/2013   CLINICAL DATA:  Altered mental status, history of stroke  EXAM: CT HEAD WITHOUT CONTRAST  TECHNIQUE: Contiguous axial images were obtained from the base of the skull through the vertex without intravenous contrast.  COMPARISON:  MRI brain dated 07/14/2006  FINDINGS: No evidence of parenchymal hemorrhage or extra-axial fluid collection. No mass lesion, mass effect, or midline shift.  No CT evidence of acute infarction. Encephalomalacic changes related to prior left MCA distribution infarct.  Subcortical white matter and periventricular small vessel ischemic changes. Intracranial atherosclerosis.  Global cortical atrophy.  No ventriculomegaly.  The visualized paranasal sinuses are essentially clear. The mastoid air cells are unopacified.  No evidence of calvarial fracture.  IMPRESSION: No evidence of acute intracranial abnormality.  Encephalomalacic changes related to prior left MCA distribution infarct.  Atrophy with small vessel ischemic changes and intracranial atherosclerosis.   Electronically Signed   By: Charline Bills M.D.   On: 11/28/2013 19:26   Mr Brain Wo Contrast  11/29/2013   CLINICAL DATA:  Altered mental status. Acute encephalopathy. Dementia. Evaluate for stroke.  EXAM: MRI HEAD WITHOUT CONTRAST  TECHNIQUE:  Multiplanar, multiecho pulse sequences of the brain and surrounding structures were obtained without intravenous contrast.  COMPARISON:  CT head 11/28/2013.  MR head 07/14/2006.  FINDINGS: The patient was unable to remain motionless for the exam. Small or subtle lesions could be overlooked.  Chronic encephalomalacia related to old left MCA territory infarction affecting much of the left hemisphere including the frontal, temporal, and parietal lobes, including the basal ganglia. No acute stroke is evident. There is no acute hemorrhage, mass lesion, hydrocephalus, or extra-axial fluid. Generalized cerebral and cerebellar atrophy. Chronic microvascular ischemic change affects the periventricular and subcortical white matter. Prominent perivascular spaces suggest chronic hypertension.  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 Some flow void signal is seen in the left MCA trunk. This LICA vessel occlusion was not present in 2007.  Pituitary, pineal, and cerebellar tonsils unremarkable. No upper cervical lesions. There may be slight chronic hemorrhage associated with the old left basal ganglia infarct.  No osseous lesions are evident. Upper cervical region unremarkable. Bilateral cataract extraction. No sinus or mastoid disease.  Compared with 2007, there has been progressive atrophy. The left MCA hemispheric infarctions were acute at that time. The left basal ganglia infarct was present before 2007.  IMPRESSION: No acute stroke or other acute intracranial findings.  Remote left MCA territory infarction.  Occluded left internal carotid artery has occurred since 2007. There appears to be collateral flow into the left anterior circulation based on flow void signal within the left MCA.   Electronically Signed   By: Davonna Belling M.D.   On: 11/29/2013 09:53   Dg Chest Port 1 View  11/28/2013    CLINICAL DATA:  Acute mental status changes.  EXAM: PORTABLE CHEST - 1 VIEW  COMPARISON:  None.  FINDINGS: The lung bases are not included on this study. Visualized portion of the long bones demonstrate enlargement of the cardiac silhouette. There is prominence of the interstitial markings, indistinctness of the pulmonary vasculature, and peribronchial cuffing. The osseous structures demonstrate degenerative changes within the right lobe shoulders. No focal regions of consolidation appreciated.  IMPRESSION: Interstitial infiltrate likely reflecting pulmonary edema within the visualized portions of the chest.   Electronically Signed   By: Salome Holmes M.D.   On: 11/28/2013  15:40   Dg Swallowing Func-speech Pathology  12/04/2013   Breck Coons [place], CCC-SLP     12/04/2013  9:51 AM Objective Swallowing Evaluation: Modified Barium Swallowing Study   Patient Details  Name: William Wade MRN: [id number] Date of Birth: 1925-11-25  Today's Date: 12/04/2013 Time: 0900-0925 SLP Time Calculation (min): 25 min  Past Medical History:  Past Medical History  Diagnosis Date  . Stroke   . Dementia   . HTN (hypertension)    Past Surgical History:  Past Surgical History  Procedure Laterality Date  . Wrist surgery    . Prostate surgery     HPI:  79 y.o. male was brought to the ER the patient was found on the  floor in his house. As per patient's son with whom I spoke over  the phone patient has not been doing well for last 3 days and has  been found to be having change in mental status has found the  patient's grandsons 3 days ago. As per patient's friend Mr.  Lynnea Ferrier patient had a fall yesterday. He was brought to the ER.  In the ER CT head did not show anything acute. Chest x-ray shows  congestion concerning for CHF.Patient does have history of  progressive dementia which has worsened after his wife's death  this 2023/03/16. Patient usually lives alone and drives. His finances  are managed by son. He does not cook. Patient has not  wanted to  go to any physicians. His son has arranged house visiting  physicians.  Additional PMH includes CVA.  MRI revealed No acute  stroke or other acute intracranial findings.  Remote left MCA  territory infarction.  CXR 12/26 reveals Interval development of  bilateral effusions, Improvement in interstitial findings  consistent with decreased pulmonary edema,  Atelectasis versus  infiltrate within the lung bases. Focal density projects within  the left lung base with differential considerations of a focal  region of consolidation no a mass within this area cannot be  excluded. Continued surveillance evaluation is recommended.     Assessment / Plan / Recommendation Clinical Impression  Dysphagia Diagnosis: Moderate oral phase dysphagia;Severe  pharyngeal phase dysphagia;Moderate pharyngeal phase dysphagia Clinical impression: Pt. exhibits decreased oral cohesion with  lingual and sublingual residue and delayed transit for moderate  oral dysphagia.  Pharyngeal phase is moderate-severe sensorimotor  deficits resulting in laryngeal penetration and/or aspiration  (silent) with any consistency thinner than puree.  Swallow  initiation is delayed to the pyrifrom sinuses with mod-max  vallecular and pyriform sinus residue.  Pt. fatigued during  assessment, unable to consistently swallow again to clear residue  and significant difficulty performing volitional cough further  increasing aspiration risk.  Son was not present for MBS.  RN  states plan is for SNF with Palliative Care services.  Family  needs education regarding risk of dehydration with pudding thick  liquids and honey thick liquids may be best choice with known  aspiration.  ST will continue for education with swallow  precuations.    Treatment Recommendation  Therapy as outlined in treatment plan below    Diet Recommendation Dysphagia 1 (Puree);Pudding-thick liquid   Liquid Administration via: Spoon Medication Administration: Crushed with puree Supervision:  Patient able to self feed;Full supervision/cueing  for compensatory strategies Compensations: Small sips/bites;Multiple dry swallows after each  bite/sip;Clear throat intermittently;Hard cough after swallow Postural Changes and/or Swallow Maneuvers: Seated upright 90  degrees;Upright 30-60 min after meal    Other  Recommendations Oral Care Recommendations: Oral care BID  Follow Up Recommendations  Skilled Nursing facility    Frequency and Duration min 2x/week  2 weeks   Pertinent Vitals/Pain WDL            Reason for Referral Objectively evaluate swallowing function   Oral Phase Oral Preparation/Oral Phase Oral Phase: Impaired Oral - Honey Oral - Honey Cup: Delayed oral transit;Lingual/palatal residue  (sublingual residue, decreased cohesion) Oral - Nectar Oral - Nectar Cup: Delayed oral transit;Lingual/palatal residue  (sublingual residue, decreased cohesion) Oral - Solids Oral - Puree: Delayed oral transit   Pharyngeal Phase Pharyngeal Phase Pharyngeal Phase: Impaired Pharyngeal - Honey Pharyngeal - Honey Teaspoon: Penetration/Aspiration during  swallow;Pharyngeal residue - pyriform sinuses;Pharyngeal residue  - valleculae;Reduced tongue base retraction;Reduced  airway/laryngeal closure;Reduced laryngeal elevation;Delayed  swallow initiation;Premature spillage to pyriform sinuses Penetration/Aspiration details (honey teaspoon): Material enters  airway, remains ABOVE vocal cords and not ejected out Pharyngeal - Honey Cup: Pharyngeal residue - pyriform  sinuses;Pharyngeal residue - valleculae;Penetration/Aspiration  during swallow;Reduced tongue base retraction;Reduced  airway/laryngeal closure;Reduced laryngeal elevation;Reduced  anterior laryngeal mobility Penetration/Aspiration details (honey cup): Material enters  airway, CONTACTS cords and not ejected out Pharyngeal - Nectar Pharyngeal - Nectar Teaspoon: Delayed swallow  initiation;Premature spillage to pyriform sinuses;Pharyngeal  residue - posterior  pharnyx;Pharyngeal residue -  valleculae;Penetration/Aspiration during swallow;Reduced tongue  base retraction;Reduced airway/laryngeal closure;Reduced  laryngeal elevation Penetration/Aspiration details (nectar teaspoon): Material enters  airway, remains ABOVE vocal cords and not ejected out;Material  enters airway, CONTACTS cords and not ejected out Pharyngeal - Nectar Cup: Delayed swallow initiation;Premature  spillage to pyriform sinuses;Pharyngeal residue -  valleculae;Pharyngeal residue - pyriform  sinuses;Penetration/Aspiration during swallow;Reduced tongue base  retraction;Reduced airway/laryngeal closure;Reduced laryngeal  elevation;Trace aspiration Penetration/Aspiration details (nectar cup): Material enters  airway, passes BELOW cords without attempt by patient to eject  out (silent aspiration) Pharyngeal - Solids Pharyngeal - Puree: Delayed swallow initiation;Premature spillage  to pyriform sinuses;Pharyngeal residue - valleculae;Reduced  laryngeal elevation;Reduced airway/laryngeal closure;Reduced  tongue base retraction;Pharyngeal residue - pyriform  sinuses;Reduced anterior laryngeal mobility  Cervical Esophageal Phase    GO    Cervical Esophageal Phase Cervical Esophageal Phase: [facility]         Darrow Bussing.Ed CCC-SLP Pager [phone]  12/04/2013    Assessment/Plan 1. PNA (pneumonia) -no symptoms at this time, conts on Levaquin until 12/08/13  2. Essential hypertension, benign Stable on current medications  3. Anemia -will follow up cbc  4. History of CVA (cerebrovascular accident) -now on coumadin therapy  5. Memory loss -conts to have episodes of increased confusion -cont to monitor   6. CHF (congestive heart failure) -currently stable -following with cardiology from hospital -conts lasix -will follow up bmp  7. DVT -noted on hospitalization in the distal common femoral vein and was started on Lovenox along with Coumadin. Pt conts on Lovenox , along with Coumadin un INR  is therapeutic -will follow up INR today due to pt being on Levaquin   8. Debility -here at [facility] for ongoing therapy and nutritional support -will be followed by PT/OT/ST and RD

## 2013-12-08 ENCOUNTER — Emergency Department (HOSPITAL_COMMUNITY): Payer: Medicare Other

## 2013-12-08 ENCOUNTER — Non-Acute Institutional Stay (SKILLED_NURSING_FACILITY): Payer: Medicare Other | Admitting: Internal Medicine

## 2013-12-08 ENCOUNTER — Emergency Department (HOSPITAL_COMMUNITY)
Admission: EM | Admit: 2013-12-08 | Discharge: 2013-12-08 | Disposition: A | Discharge status: Home / Self Care | Payer: Medicare Other | Attending: Emergency Medicine | Admitting: Emergency Medicine

## 2013-12-08 ENCOUNTER — Encounter: Payer: Self-pay | Admitting: Internal Medicine

## 2013-12-08 ENCOUNTER — Encounter (HOSPITAL_COMMUNITY): Payer: Self-pay | Admitting: Emergency Medicine

## 2013-12-08 DIAGNOSIS — Z8673 Personal history of transient ischemic attack (TIA), and cerebral infarction without residual deficits: Secondary | ICD-10-CM | POA: Insufficient documentation

## 2013-12-08 DIAGNOSIS — Z79899 Other long term (current) drug therapy: Secondary | ICD-10-CM | POA: Insufficient documentation

## 2013-12-08 DIAGNOSIS — Z7901 Long term (current) use of anticoagulants: Secondary | ICD-10-CM | POA: Insufficient documentation

## 2013-12-08 DIAGNOSIS — N179 Acute kidney failure, unspecified: Secondary | ICD-10-CM

## 2013-12-08 DIAGNOSIS — Z792 Long term (current) use of antibiotics: Secondary | ICD-10-CM | POA: Insufficient documentation

## 2013-12-08 DIAGNOSIS — I10 Essential (primary) hypertension: Secondary | ICD-10-CM | POA: Insufficient documentation

## 2013-12-08 DIAGNOSIS — Z86718 Personal history of other venous thrombosis and embolism: Secondary | ICD-10-CM | POA: Insufficient documentation

## 2013-12-08 DIAGNOSIS — Z7189 Other specified counseling: Secondary | ICD-10-CM | POA: Insufficient documentation

## 2013-12-08 DIAGNOSIS — R059 Cough, unspecified: Secondary | ICD-10-CM

## 2013-12-08 DIAGNOSIS — S2232XA Fracture of one rib, left side, initial encounter for closed fracture: Secondary | ICD-10-CM

## 2013-12-08 DIAGNOSIS — F0390 Unspecified dementia without behavioral disturbance: Secondary | ICD-10-CM | POA: Insufficient documentation

## 2013-12-08 DIAGNOSIS — IMO0001 Reserved for inherently not codable concepts without codable children: Secondary | ICD-10-CM | POA: Insufficient documentation

## 2013-12-08 DIAGNOSIS — I82412 Acute embolism and thrombosis of left femoral vein: Secondary | ICD-10-CM | POA: Insufficient documentation

## 2013-12-08 DIAGNOSIS — J9601 Acute respiratory failure with hypoxia: Secondary | ICD-10-CM

## 2013-12-08 DIAGNOSIS — E860 Dehydration: Secondary | ICD-10-CM

## 2013-12-08 DIAGNOSIS — Z87891 Personal history of nicotine dependence: Secondary | ICD-10-CM | POA: Insufficient documentation

## 2013-12-08 DIAGNOSIS — R05 Cough: Secondary | ICD-10-CM

## 2013-12-08 DIAGNOSIS — I9589 Other hypotension: Secondary | ICD-10-CM

## 2013-12-08 DIAGNOSIS — J9600 Acute respiratory failure, unspecified whether with hypoxia or hypercapnia: Secondary | ICD-10-CM

## 2013-12-08 LAB — CBC WITH DIFFERENTIAL/PLATELET
Basophils Absolute: 0 10*3/uL (ref 0.0–0.1)
Basophils Relative: 0 % (ref 0–1)
EOS PCT: 0 % (ref 0–5)
Eosinophils Absolute: 0 10*3/uL (ref 0.0–0.7)
HEMATOCRIT: 33 % — AB (ref 39.0–52.0)
Hemoglobin: 10.7 g/dL — ABNORMAL LOW (ref 13.0–17.0)
LYMPHS ABS: 1.1 10*3/uL (ref 0.7–4.0)
Lymphocytes Relative: 10 % — ABNORMAL LOW (ref 12–46)
MCH: 31.9 pg (ref 26.0–34.0)
MCHC: 32.4 g/dL (ref 30.0–36.0)
MCV: 98.5 fL (ref 78.0–100.0)
MONO ABS: 0.8 10*3/uL (ref 0.1–1.0)
Monocytes Relative: 7 % (ref 3–12)
Neutro Abs: 9.1 10*3/uL — ABNORMAL HIGH (ref 1.7–7.7)
Neutrophils Relative %: 82 % — ABNORMAL HIGH (ref 43–77)
Platelets: 227 10*3/uL (ref 150–400)
RBC: 3.35 MIL/uL — AB (ref 4.22–5.81)
RDW: 15.7 % — ABNORMAL HIGH (ref 11.5–15.5)
WBC: 11 10*3/uL — ABNORMAL HIGH (ref 4.0–10.5)

## 2013-12-08 LAB — URINALYSIS, ROUTINE W REFLEX MICROSCOPIC
Bilirubin Urine: NEGATIVE
GLUCOSE, UA: NEGATIVE mg/dL
Hgb urine dipstick: NEGATIVE
KETONES UR: NEGATIVE mg/dL
Leukocytes, UA: NEGATIVE
Nitrite: NEGATIVE
Protein, ur: NEGATIVE mg/dL
Specific Gravity, Urine: 1.014 (ref 1.005–1.030)
Urobilinogen, UA: 1 mg/dL (ref 0.0–1.0)
pH: 7 (ref 5.0–8.0)

## 2013-12-08 LAB — POCT I-STAT, CHEM 8
BUN: 50 mg/dL — ABNORMAL HIGH (ref 6–23)
CHLORIDE: 95 meq/L — AB (ref 96–112)
CREATININE: 2.2 mg/dL — AB (ref 0.50–1.35)
Calcium, Ion: 1.13 mmol/L (ref 1.13–1.30)
GLUCOSE: 143 mg/dL — AB (ref 70–99)
HEMATOCRIT: 36 % — AB (ref 39.0–52.0)
HEMOGLOBIN: 12.2 g/dL — AB (ref 13.0–17.0)
POTASSIUM: 3.6 meq/L — AB (ref 3.7–5.3)
Sodium: 141 mEq/L (ref 137–147)
TCO2: 33 mmol/L (ref 0–100)

## 2013-12-08 LAB — CG4 I-STAT (LACTIC ACID): Lactic Acid, Venous: 1.91 mmol/L (ref 0.5–2.2)

## 2013-12-08 LAB — PROTIME-INR
INR: 2.44 — ABNORMAL HIGH (ref 0.00–1.49)
PROTHROMBIN TIME: 25.7 s — AB (ref 11.6–15.2)

## 2013-12-08 LAB — PRO B NATRIURETIC PEPTIDE: PRO B NATRI PEPTIDE: 9621 pg/mL — AB (ref 0–450)

## 2013-12-08 MED ORDER — IPRATROPIUM-ALBUTEROL 0.5-2.5 (3) MG/3ML IN SOLN
3.0000 mL | Freq: Once | RESPIRATORY_TRACT | Status: AC
  Administered 2013-12-08: 3 mL via RESPIRATORY_TRACT
  Filled 2013-12-08: qty 3

## 2013-12-08 MED ORDER — SODIUM CHLORIDE 0.9 % IV SOLN
Freq: Once | INTRAVENOUS | Status: AC
  Administered 2013-12-08: 23:00:00 via INTRAVENOUS

## 2013-12-08 NOTE — ED Provider Notes (Signed)
Level V caveat dementia. Patient currently treated for pneumonia with Levaquin. 70 decreased blood pressure. Patient continues to cough. Exam chronically ill-appearing no respiratory distress lungs with scant rhonchi. Coughing.  Doug SouSam Latrina Guttman, MD 12/08/13 2223

## 2013-12-08 NOTE — Assessment & Plan Note (Signed)
Prior-no new CVA

## 2013-12-08 NOTE — Assessment & Plan Note (Signed)
On Lovenox and coumadin until coumadin theraputic

## 2013-12-08 NOTE — ED Notes (Signed)
Per EMS - pt coming from [facility], memory care unit. Hx of dementia. Per staff- pt BP 70/40, administered 250 ml NS, O2 sats were in 70s% so they gave him a neb treatment over the course of a few hours. Upon EMS arrival BP 118/60, pt had oxygen mask on, O2 sats 100%. Lung sounds tight in lower lobes, hx of COPD. Staff at facility started a 24G in right forearm.

## 2013-12-08 NOTE — Assessment & Plan Note (Signed)
Looks like anemia of chronic dx and iron def;will start ferrous sulfate 325 mg BID

## 2013-12-08 NOTE — Assessment & Plan Note (Signed)
advancing and initial cause of hospitalizATION

## 2013-12-08 NOTE — Discharge Instructions (Signed)
Continue the normal saline 0.9% at 100 cc/hr for the next 10 hours with a stop time of 08:00 AM. Please discontinue to the IV at that time. Please recheck kidney function in the next 1-2 days. Return with any worsening of symptoms, low blood pressure or low oxygen saturations. Continue to hold lasix until seen by primary care physician in follow up. You have two rib fractures. These rib fractures were ween on the chest x-ray you had on 12/01/13 so don't appear to be new. You may take tylenol as needed for the pain.

## 2013-12-08 NOTE — Progress Notes (Signed)
MRN: [id number] Name: William Wade  Sex: male Age: 80 y.o. DOB: 08-26-1925  PSC #: [facility] Facility/Room:  220 Level Of Care: SNF Provider: Merrilee Seashore D Emergency Contacts: Extended Emergency Contact Information Primary Emergency Contact: Lincks,Sol Address: [phone] [place], [place] [address] [place] Home Phone: [phone] Relation: Son Secondary Emergency Contact: Mikki Santee [place] of [place] Mobile Phone: [phone] Relation: Friend  Code Status: DNR  Allergies: Review of patient's allergies indicates no known allergies.  Chief Complaint  Patient presents with  . nursing home admission    HPI: Patient is 80 y.o. male who is admitted after hosp for encephalopathy felt to be secondary to dementia. Pt was dx with DVT there and possible PNA also.  Past Medical History  Diagnosis Date  . Stroke   . Dementia   . HTN (hypertension)     Past Surgical History  Procedure Laterality Date  . Wrist surgery    . Prostate surgery        Medication List    Notice   Cannot display patient medications because the patient has not yet arrived.      Meds ordered this encounter  Medications  . furosemide (LASIX) 40 MG tablet    Sig: Take 60 mg by mouth 2 (two) times daily.  . metoprolol succinate (TOPROL-XL) 25 MG 24 hr tablet    Sig: Take 25 mg by mouth 2 (two) times daily.    Immunization History  Administered Date(s) Administered  . Influenza Whole 09/10/2012  . Tdap 06/28/2013    History  Substance Use Topics  . Smoking status: Former Games developer  . Smokeless tobacco: Not on file  . Alcohol Use: No    Family history is noncontributory    Review of Systems  DATA OBTAINED: from patient; PT HAS NO C/O GENERAL: Feels well no fevers, fatigue, appetite changes SKIN: No itching, rash or wounds EYES: No eye pain, redness, discharge EARS: No earache, tinnitus, change in hearing NOSE: No congestion, drainage or  bleeding  MOUTH/THROAT: No mouth or tooth pain, No sore throat, No difficulty chewing or swallowing  RESPIRATORY: No cough, wheezing, SOB CARDIAC: No chest pain, palpitations, lower extremity edema  GI: No abdominal pain, No N/V/D or constipation, No heartburn or reflux  GU: No dysuria, frequency or urgency, or incontinence  MUSCULOSKELETAL: No unrelieved bone/joint pain NEUROLOGIC: No headache, dizziness or focal weakness PSYCHIATRIC: No overt anxiety or sadness. Sleeps well. No behavior issue.   Filed Vitals:   12/08/13 0929  BP: 97/52  Pulse: 74  Temp: 97.7 F (36.5 C)  Resp: 20    Physical Exam  GENERAL APPEARANCE: Alert, MINIMALLYconversant. Appropriately groomed. No acute distress.  SKIN: No diaphoresis rash HEAD: Normocephalic, atraumatic  EYES: Conjunctiva/lids clear. Pupils round, reactive. EOMs intact.  EARS: External exam WNL, canals clear. Hearing grossly normal.  NOSE: No deformity or discharge.  MOUTH/THROAT: Lips w/o lesions.   RESPIRATORY: Breathing is even, unlabored. Lung sounds are DIFFUSELY DECREASED  CARDIOVASCULAR: Heart RRR no murmurs, rubs or gallops. No peripheral edema.  GASTROINTESTINAL: Abdomen is soft, non-tender, not distended w/ normal bowel sounds. No mass, ventral or inguinal hernia. No organomegally GENITOURINARY: Bladder non tender, not distended  MUSCULOSKELETAL: No abnormal joints or musculature NEUROLOGIC: Oriented X1; Cranial nerves 2-12 grossly intact. Moves all extremities no tremor. PSYCHIATRIC: dementia, no behavioral issues  Patient Active Problem List   Diagnosis Date Noted  . Left femoral vein DVT 12/08/2013  .  Counseling regarding end of life decision making 12/08/2013  . Dementia without behavioral disturbance 12/08/2013  . PNA (pneumonia) 12/02/2013  . Acute systolic heart failure 11/29/2013  . Altered mental status 11/28/2013  . Acute encephalopathy 11/28/2013  . CHF (congestive heart failure) 11/28/2013  . Hypokalemia  11/28/2013  . Anemia 11/28/2013  . History of CVA (cerebrovascular accident) 11/28/2013  . VITAMIN B12 DEFICIENCY 02/04/2011  . ANEMIA, IRON DEFICIENCY 02/03/2011  . GERD 02/03/2011  . MEMORY LOSS 02/03/2011  . ESSENTIAL HYPERTENSION, BENIGN 01/29/2011  . CVA 01/29/2011  . CEREBRAL ATHEROSCLEROSIS 01/29/2011    CBC    Component Value Date/Time   WBC 11.0* 12/08/2013 1830   RBC 3.35* 12/08/2013 1830   RBC 3.12* 11/28/2013 2300   HGB 12.2* 12/08/2013 1846   HCT 36.0* 12/08/2013 1846   PLT 227 12/08/2013 1830   MCV 98.5 12/08/2013 1830   LYMPHSABS 1.1 12/08/2013 1830   MONOABS 0.8 12/08/2013 1830   EOSABS 0.0 12/08/2013 1830   BASOSABS 0.0 12/08/2013 1830    CMP     Component Value Date/Time   NA 141 12/08/2013 1846   K 3.6* 12/08/2013 1846   CL 95* 12/08/2013 1846   CO2 39* 12/04/2013 0448   GLUCOSE 143* 12/08/2013 1846   BUN 50* 12/08/2013 1846   CREATININE 2.20* 12/08/2013 1846   CALCIUM 8.5 12/04/2013 0448   PROT 5.8* 11/29/2013 0450   ALBUMIN 2.7* 11/29/2013 0450   AST 27 11/29/2013 0450   ALT 21 11/29/2013 0450   ALKPHOS 59 11/29/2013 0450   BILITOT 1.2 11/29/2013 0450   GFRNONAA 39* 12/04/2013 0448   GFRAA 46* 12/04/2013 0448    Assessment and Plan  Acute encephalopathy Presentation at hospital, felt to be worsening dementia; neg w/u except mild CHF and questionable PNA at end of hospitalization requiring Levaquin until 1/2  Acute systolic heart failure ECHO- - EF 30-35%; diuresed at hospital ;d/c lasix is 60 mg BID; D/C WEIGHT 153 POUNDS  ESSENTIAL HYPERTENSION, BENIGN BP lowish last 2 readings;wil;l watch BMP with his vigorous Lasix dose  Anemia Looks like anemia of chronic dx and iron def;will start ferrous sulfate 325 mg BID  Left femoral vein DVT On Lovenox and coumadin until coumadin theraputic  PNA (pneumonia) Questionable PNA on CXR - Levaquin stated in hosp-to continue through 12/08/13  History of CVA (cerebrovascular accident) Prior-no new CVA  Counseling regarding  end of life decision making Pallative care saw pt in hospital and most form signed  Dementia without behavioral disturbance advancing and initial cause of hospitalizATION    Margit HanksALEXANDER, Julie-Ann Vanmaanen D, MD

## 2013-12-08 NOTE — ED Notes (Signed)
[facility] to verify that they can maintain IVF administration.

## 2013-12-08 NOTE — Assessment & Plan Note (Signed)
Pallative care saw pt in hospital and most form signed

## 2013-12-08 NOTE — Assessment & Plan Note (Addendum)
ECHO- - EF 30-35%; diuresed at hospital ;d/c lasix is 60 mg BID; D/C WEIGHT 153 POUNDS

## 2013-12-08 NOTE — ED Notes (Signed)
Pt denies feeling sob/cp. sts he doesn't have pain anywhere. Pt does have a cough, denies coughing anything up. Nad, skin warm and dry, resp e/u.

## 2013-12-08 NOTE — ED Provider Notes (Signed)
CSN: [id number]     Arrival date & time 12/08/13  1718 History   First MD Initiated Contact with Patient 12/08/13 1720     Chief Complaint  Patient presents with  . Shortness of Breath   HPI  William Wade is a 80 y/o male with history of CVA and dementia. Comes from [facility] home for concerns of hypotension and low O2 sats. Currently on treatment for both DVT (lovenox and coumadin) and pneumonia (levaquin) Per EMS the patient had a BP of 70/40 earlier today which responded to IVF. The patient's O2 sats were 70's on RA and he was given a neb treatment with improvement of his O2 sats. The patient currently denies any complaints.   Past Medical History  Diagnosis Date  . Stroke   . Dementia   . HTN (hypertension)    Past Surgical History  Procedure Laterality Date  . Wrist surgery    . Prostate surgery     Family History  Problem Relation Age of Onset  . Lung cancer Father    History  Substance Use Topics  . Smoking status: Former Games developermoker  . Smokeless tobacco: Not on file  . Alcohol Use: No   Review of Systems  Unable to perform ROS: Dementia   Allergies  Review of patient's allergies indicates no known allergies.  Home Medications   Current Outpatient Rx  Name  Route  Sig  Dispense  Refill  . enalapril (VASOTEC) 5 MG tablet   Oral   Take 1 tablet (5 mg total) by mouth daily.   30 tablet   2   . furosemide (LASIX) 40 MG tablet   Oral   Take 60 mg by mouth 2 (two) times daily.         Marland Kitchen. levofloxacin (LEVAQUIN) 750 MG tablet   Oral   Take 750 mg by mouth daily. Started 12/08/13, for 5 days, ending 12/12/13.         . metoprolol succinate (TOPROL-XL) 25 MG 24 hr tablet   Oral   Take 25 mg by mouth 2 (two) times daily.         Marland Kitchen. spironolactone (ALDACTONE) 25 MG tablet   Oral   Take 1 tablet (25 mg total) by mouth daily.   30 tablet   2   . warfarin (COUMADIN) 6 MG tablet   Oral   Take 6 mg by mouth daily at 6 PM.         . ferrous sulfate 325 (65  FE) MG EC tablet   Oral   Take 325 mg by mouth 3 (three) times daily with meals.          BP 120/70  Pulse 60  Temp(Src) 98 F (36.7 C) (Oral)  Resp 20  SpO2 100% Physical Exam  Nursing note and vitals reviewed. Constitutional: No distress.  thin  HENT:  Head: Normocephalic and atraumatic.  Eyes: Conjunctivae are normal. Pupils are equal, round, and reactive to light.  Neck: Normal range of motion. Neck supple.  Cardiovascular: Normal rate.  Exam reveals no gallop and no friction rub.   No murmur heard. Pulmonary/Chest:  Course breath sounds bilaterally. LLL rhonchi  Abdominal: Soft. He exhibits no distension. There is no tenderness.  Musculoskeletal: Normal range of motion. He exhibits no edema and no tenderness.  Neurological: He is alert.  Skin: Skin is warm and dry.  Psychiatric: He has a normal mood and affect.   ED Course  Procedures (including critical care time)  Labs Review Labs Reviewed  CBC WITH DIFFERENTIAL - Abnormal; Notable for the following:    WBC 11.0 (*)    RBC 3.35 (*)    Hemoglobin 10.7 (*)    HCT 33.0 (*)    RDW 15.7 (*)    Neutrophils Relative % 82 (*)    Neutro Abs 9.1 (*)    Lymphocytes Relative 10 (*)    All other components within normal limits  PRO B NATRIURETIC PEPTIDE - Abnormal; Notable for the following:    Pro B Natriuretic peptide (BNP) 9621.0 (*)    All other components within normal limits  PROTIME-INR - Abnormal; Notable for the following:    Prothrombin Time 25.7 (*)    INR 2.44 (*)    All other components within normal limits  POCT I-STAT, CHEM 8 - Abnormal; Notable for the following:    Potassium 3.6 (*)    Chloride 95 (*)    BUN 50 (*)    Creatinine, Ser 2.20 (*)    Glucose, Bld 143 (*)    Hemoglobin 12.2 (*)    HCT 36.0 (*)    All other components within normal limits  CULTURE, BLOOD (ROUTINE X 2)  CULTURE, BLOOD (ROUTINE X 2)  URINALYSIS, ROUTINE W REFLEX MICROSCOPIC  CG4 I-STAT (LACTIC ACID)   Imaging  Review Dg Chest Portable 1 View  12/08/2013   CLINICAL DATA:  Cough, shortness of breath  EXAM: PORTABLE CHEST - 1 VIEW  COMPARISON:  12/01/2013  FINDINGS: There is mild bilateral interstitial thickening. There is a trace right pleural effusion. There is no left pleural effusion. Stable heart and mediastinum.  There are nondisplaced rib fractures involving the right lateral 9th and 10th ribs.  IMPRESSION: Small right pleural effusion without evidence of pulmonary edema.  Nondisplaced fractures of the right lateral 9th and 10th ribs.   Electronically Signed   By: Elige Ko   On: 12/08/2013 19:39    EKG Interpretation    Date/Time:  Friday December 08 2013 18:10:00 EST Ventricular Rate:  79 PR Interval:  146 QRS Duration: 95 QT Interval:  436 QTC Calculation: 500 R Axis:   -72 Text Interpretation:  Sinus rhythm Left anterior fascicular block Nonspecific T abnormalities, lateral leads Borderline prolonged QT interval No significant change since last tracing Confirmed by JACUBOWITZ  MD, SAM ([id number]) on 12/08/2013 6:16:50 PM           MDM   1. Acute kidney injury   2. Cough   3. Dehydration   4. Rib fractures, left, closed, initial encounter    Afebrile here. Chronically ill but denies complaints. O2 sats of 100% on RA. No tachypnea. Initial blood pressure of 97/52 but this normalized to 117/56. He remained HDS throughout his entire ED stay and have been higher than his recorded on discharge on 12/31.  Lactate was normal. He was afebrile by rectal temp. Slight leukocytosis of 11.0. UA not c/w UTI. CXR not c/w pneumonia but with small right pleural effusion. Rib fractures seen on right lateral 9th and 10th ribs. Spoke with radiologist Allena Katz who states these fractures were present on 12/26 so don't suspect acute fractures. Don't suspect sepsis. PE considered given recent diagnosis of DVT. However, don't suspect PE given the patient has been treated with Lovenox and is currently therapeutic in  regards to INR. No tachypnea or hypoxia noted throughout his entire ED stay. The patient's labs did reveal and AKI. Likely secondary to dehydration from poor intake and diuresis. Likely needs to  have lasix held and placed on IVF for a short time. Spoke with the patient's son who would like the patient to go back to nursing home if they can do fluids. Nursing spoke to the facility which can do IVF with physician order. Order placed for NS 0.9% at 100 cc/hr for 10 hours. Written instructions to have Cr rechecked and patient seen by facility physician tomorrow if not doing better.    Shanon Ace, MD 12/08/13 [phone]

## 2013-12-08 NOTE — Assessment & Plan Note (Signed)
Questionable PNA on CXR - Levaquin stated in hosp-to continue through 12/08/13

## 2013-12-08 NOTE — Assessment & Plan Note (Signed)
BP lowish last 2 readings;wil;l watch BMP with his vigorous Lasix dose

## 2013-12-08 NOTE — Assessment & Plan Note (Signed)
Presentation at hospital, felt to be worsening dementia; neg w/u except mild CHF and questionable PNA at end of hospitalization requiring Levaquin until 1/2

## 2013-12-08 NOTE — Progress Notes (Signed)
MRN: [id number] Name: William Wade  Sex: male Age: 80 y.o. DOB: Jan 02, 1925  PSC #: [facility] Facility/Room: 220 Level Of Care: SNF Provider: Merrilee Seashore D Emergency Contacts: Extended Emergency Contact Information Primary Emergency Contact: Tome,Jamion Address: [phone] [place], [place] [address] [place] of [place] Home Phone: [phone] Relation: Son Secondary Emergency Contact: Mikki Santee [place] of [place] Mobile Phone: [phone] Relation: Friend  Code Status: DNR  Allergies: Review of patient's allergies indicates no known allergies.  Chief Complaint  Patient presents with  . Acute Visit    HPI: Patient is 80 y.o. male who was just admitted SNF after a MS change, felt to be sec to dementia and a was dx with a DVT and possible PNA for which pt is currently taking Levaquin. The nurse has asked me to see him because his SBP is 70 and O2 sat 78% on 2L.   Past Medical History  Diagnosis Date  . Stroke   . Dementia   . HTN (hypertension)     Past Surgical History  Procedure Laterality Date  . Wrist surgery    . Prostate surgery        Medication List    Notice   Cannot display patient medications because the patient has not yet arrived.     Current Outpatient Prescriptions on File Prior to Visit  Medication Sig Dispense Refill  . enalapril (VASOTEC) 5 MG tablet Take 1 tablet (5 mg total) by mouth daily.  30 tablet  2  . ferrous sulfate 325 (65 FE) MG EC tablet Take 325 mg by mouth 3 (three) times daily with meals.      . furosemide (LASIX) 40 MG tablet Take 60 mg by mouth 2 (two) times daily.      Marland Kitchen levofloxacin (LEVAQUIN) 750 MG tablet Take 750 mg by mouth daily. Started 12/08/13, for 5 days, ending 12/12/13.      . metoprolol succinate (TOPROL-XL) 25 MG 24 hr tablet Take 25 mg by mouth 2 (two) times daily.      Marland Kitchen spironolactone (ALDACTONE) 25 MG tablet Take 1 tablet (25 mg total) by mouth daily.  30 tablet  2  . warfarin  (COUMADIN) 6 MG tablet Take 6 mg by mouth daily at 6 PM.       No current facility-administered medications on file prior to visit.     No orders of the defined types were placed in this encounter.    Immunization History  Administered Date(s) Administered  . Influenza Whole 09/10/2012  . Tdap 06/28/2013    History  Substance Use Topics  . Smoking status: Former Games developer  . Smokeless tobacco: Not on file  . Alcohol Use: No    Review of Systems  DATA OBTAINED: from patient; pt denies any c/o GENERAL: Feels well no fevers, fatigue, appetite changes SKIN: No itching, rash HEENT: No complaint RESPIRATORY: No cough, wheezing, SOB CARDIAC: No chest pain, palpitations, lower extremity edema  GI: No abdominal pain, No N/V/D or constipation, No heartburn or reflux  GU: No dysuria, frequency or urgency, or incontinence  MUSCULOSKELETAL: No unrelieved bone/joint pain NEUROLOGIC: No headache, dizziness or focal weakness PSYCHIATRIC: No overt anxiety or sadness. Sleeps well.   Filed Vitals:   12/08/13 2030  Resp: 20   SBP - 70 Physical Exam  GENERAL APPEARANCE: Alert, minimally conversant.. No acute distress ; remarkably pt is sitting in a WC SKIN: No diaphoresis rash HEENT: Unremarkable RESPIRATORY: Breathing is  even, unlabored. Lung sounds diffusely dec   CARDIOVASCULAR: Heart RRR no murmurs, rubs or gallops. No peripheral edema ; cannot appreciate a radial pulse;he does have an axillary pulse; pulse ox at the bedside seems to pick up pulse more on than off but not enough for any reading; fingertips cool, but not cold GASTROINTESTINAL: Abdomen is soft, non-tender, not distended w/ normal bowel sounds.  GENITOURINARY: Bladder non tender, not distended  MUSCULOSKELETAL: No abnormal joints or musculature NEUROLOGIC: Cranial nerves 2-12 grossly intact. Moves all extremities no tremor. PSYCHIATRIC: baseline dementia but pt is not attending as well today, no behavioral  issues  Patient Active Problem List   Diagnosis Date Noted  . Left femoral vein DVT 12/08/2013  . Counseling regarding end of life decision making 12/08/2013  . Dementia without behavioral disturbance 12/08/2013  . PNA (pneumonia) 12/02/2013  . Acute systolic heart failure 11/29/2013  . Altered mental status 11/28/2013  . Acute encephalopathy 11/28/2013  . CHF (congestive heart failure) 11/28/2013  . Hypokalemia 11/28/2013  . Anemia 11/28/2013  . History of CVA (cerebrovascular accident) 11/28/2013  . VITAMIN B12 DEFICIENCY 02/04/2011  . ANEMIA, IRON DEFICIENCY 02/03/2011  . GERD 02/03/2011  . MEMORY LOSS 02/03/2011  . ESSENTIAL HYPERTENSION, BENIGN 01/29/2011  . CVA 01/29/2011  . CEREBRAL ATHEROSCLEROSIS 01/29/2011    CBC  12/31   Wbc  11.O  9.5/27.3  plt 206  75 SEGS, 16 L    Component Value Date/Time   WBC 11.0* 12/08/2013 1830   RBC 3.35* 12/08/2013 1830   RBC 3.12* 11/28/2013 2300   HGB 12.2* 12/08/2013 1846   HCT 36.0* 12/08/2013 1846   PLT 227 12/08/2013 1830   MCV 98.5 12/08/2013 1830   LYMPHSABS 1.1 12/08/2013 1830   MONOABS 0.8 12/08/2013 1830   EOSABS 0.0 12/08/2013 1830   BASOSABS 0.0 12/08/2013 1830    CMP 12/31  137, 4.3, 94, 31, 122, 44/1.86, ca 8.2     EST GFR  32    Component Value Date/Time   NA 141 12/08/2013 1846   K 3.6* 12/08/2013 1846   CL 95* 12/08/2013 1846   CO2 39* 12/04/2013 0448   GLUCOSE 143* 12/08/2013 1846   BUN 50* 12/08/2013 1846   CREATININE 2.20* 12/08/2013 1846   CALCIUM 8.5 12/04/2013 0448   PROT 5.8* 11/29/2013 0450   ALBUMIN 2.7* 11/29/2013 0450   AST 27 11/29/2013 0450   ALT 21 11/29/2013 0450   ALKPHOS 59 11/29/2013 0450   BILITOT 1.2 11/29/2013 0450   GFRNONAA 39* 12/04/2013 0448   GFRAA 46* 12/04/2013 0448    Assessment and Plan  HYPOXIA/HYPOTENSION- multiple interventions over time- I told nurse to get CXR and inc O2 to as high as it will go , here at SNF that is usually 5 liters; he was on 5 liters when I saw him; initally stopped pt's  Lasix for 3 days, then based on SBP and labs decided to give him 250cc NS over several hours. The absence of a peripheral pulse and presence of central pulse verifed that the SBP was around 70. The nurse reported SBP 90 after fluids. TID and q6 hr nebs were started and O2 sat still read 78 according to nursing on 5 liters after the first neb The O2 sat was suspect since I couldn't get a reading with my pulse oximeter but I have no choice but to believe it.. In the meantime the son was called he OKed a visit to hospital even though his MOST form said no  hospital. She couldn't find a non rebreather mask at SNF.  The CXR was read as bibasilar infiltrate L.>R  And I was also worried about PE also so pt was sent to the hospital.                                                                                                                                                                                    Margit Hanks\Kyelle Urbas D, MD

## 2013-12-09 NOTE — ED Provider Notes (Signed)
I have personally seen and examined the patient.  I have discussed the plan of care with the resident.  I have reviewed the documentation on PMH/FH/Soc. History.  I have reviewed the documentation of the resident and agree. I read the EKG concurrently with the resident and agree with the resident's interpretation  Doug SouSam Ressie Slevin, MD 12/09/13 [phone]

## 2013-12-11 ENCOUNTER — Non-Acute Institutional Stay (SKILLED_NURSING_FACILITY): Payer: Medicare Other | Admitting: Internal Medicine

## 2013-12-11 ENCOUNTER — Encounter: Payer: Self-pay | Admitting: Internal Medicine

## 2013-12-11 DIAGNOSIS — S2241XD Multiple fractures of ribs, right side, subsequent encounter for fracture with routine healing: Secondary | ICD-10-CM

## 2013-12-11 DIAGNOSIS — IMO0001 Reserved for inherently not codable concepts without codable children: Secondary | ICD-10-CM

## 2013-12-11 DIAGNOSIS — R1310 Dysphagia, unspecified: Secondary | ICD-10-CM

## 2013-12-11 DIAGNOSIS — E860 Dehydration: Secondary | ICD-10-CM

## 2013-12-11 NOTE — Progress Notes (Signed)
MRN: [id number] Name: William Wade  Sex: male Age: 80 y.o. DOB: Jan 06, 1925  PSC #: [facility] Facility/Room: 220 Level Of Care: SNF Provider: Merrilee Seashore D Emergency Contacts: Extended Emergency Contact Information Primary Emergency Contact: Shirk,Tryton Address: [phone] [place], [place] [address] [place] Home Phone: [phone] Relation: Son Secondary Emergency Contact: Mikki Santee [place] of [place] Mobile Phone: [phone] Relation: Friend  Code Status: DNR  Allergies: Review of patient's allergies indicates no known allergies.  Chief Complaint  Patient presents with  . Hospitalization Follow-up    HPI: Patient is 80 y.o. male who went to ED 1/2 for hypoxia and hypotension. He returned later that evening. O2 sats at ED were 98% and BP improved. Pt was sent back to SNF with NS at 100 cc/r for 10 hours and continue hold lasix. Incidentally pt had rib fx R 9 and 10 not seen on SNF CXR, probably from his fall before prior hospitalization.  Past Medical History  Diagnosis Date  . Stroke   . Dementia   . HTN (hypertension)     Past Surgical History  Procedure Laterality Date  . Wrist surgery    . Prostate surgery        Medication List       This list is accurate as of: 12/11/13  2:07 PM.  Always use your most recent med list.               enalapril 5 MG tablet  Commonly known as:  VASOTEC  Take 1 tablet (5 mg total) by mouth daily.     ferrous sulfate 325 (65 FE) MG EC tablet  Take 325 mg by mouth 3 (three) times daily with meals.     furosemide 40 MG tablet  Commonly known as:  LASIX  Take 60 mg by mouth 2 (two) times daily.     levofloxacin 750 MG tablet  Commonly known as:  LEVAQUIN  Take 750 mg by mouth daily. Started 12/08/13, for 5 days, ending 12/12/13.     metoprolol succinate 25 MG 24 hr tablet  Commonly known as:  TOPROL-XL  Take 25 mg by mouth 2 (two) times daily.     spironolactone 25 MG tablet   Commonly known as:  ALDACTONE  Take 1 tablet (25 mg total) by mouth daily.     warfarin 6 MG tablet  Commonly known as:  COUMADIN  Take 6 mg by mouth daily at 6 PM.        No orders of the defined types were placed in this encounter.    Immunization History  Administered Date(s) Administered  . Influenza Whole 09/10/2012  . Tdap 06/28/2013    History  Substance Use Topics  . Smoking status: Former Games developer  . Smokeless tobacco: Not on file  . Alcohol Use: No    Review of Systems  DATA OBTAINED:  Filed Vitals:   12/11/13 1405  BP: 118/64  Pulse: 90  Temp: 98.4 F (36.9 C)  Resp: 20  118/64  P 90    RR 20  Physical Exam  GENERAL APPEARANCE: Alert, minimallyconversant. Appropriately groomed. No acute distress ; pt looks the same no matter is BP or his oxygenation SKIN: No diaphoresis rash HEENT: Unremarkable RESPIRATORY: Breathing is even, unlabored. Lung sounds are diffusely decreased;   O2 sat at bedside per ADAMD was 91% with pulse 84 on 2L  CARDIOVASCULAR: Heart RRR no murmurs, rubs or gallops. No  peripheral edema  GASTROINTESTINAL: Abdomen is soft, non-tender, not distended w/ normal bowel sounds.  GENITOURINARY: Bladder non tender, not distended  MUSCULOSKELETAL: No abnormal joints or musculature NEUROLOGIC: Cranial nerves 2-12 grossly intact. Moves all extremities no tremor. PSYCHIATRIC: Mood and affect appropriate to situation, no behavioral issues  Patient Active Problem List   Diagnosis Date Noted  . Left femoral vein DVT 12/08/2013  . Counseling regarding end of life decision making 12/08/2013  . Dementia without behavioral disturbance 12/08/2013  . PNA (pneumonia) 12/02/2013  . Acute systolic heart failure 11/29/2013  . Altered mental status 11/28/2013  . Acute encephalopathy 11/28/2013  . CHF (congestive heart failure) 11/28/2013  . Hypokalemia 11/28/2013  . Anemia 11/28/2013  . History of CVA (cerebrovascular accident) 11/28/2013  . VITAMIN  B12 DEFICIENCY 02/04/2011  . ANEMIA, IRON DEFICIENCY 02/03/2011  . GERD 02/03/2011  . MEMORY LOSS 02/03/2011  . ESSENTIAL HYPERTENSION, BENIGN 01/29/2011  . CVA 01/29/2011  . CEREBRAL ATHEROSCLEROSIS 01/29/2011    CBC    Component Value Date/Time   WBC 11.0* 12/08/2013 1830   RBC 3.35* 12/08/2013 1830   RBC 3.12* 11/28/2013 2300   HGB 12.2* 12/08/2013 1846   HCT 36.0* 12/08/2013 1846   PLT 227 12/08/2013 1830   MCV 98.5 12/08/2013 1830   LYMPHSABS 1.1 12/08/2013 1830   MONOABS 0.8 12/08/2013 1830   EOSABS 0.0 12/08/2013 1830   BASOSABS 0.0 12/08/2013 1830    CMP     Component Value Date/Time   NA 141 12/08/2013 1846   K 3.6* 12/08/2013 1846   CL 95* 12/08/2013 1846   CO2 39* 12/04/2013 0448   GLUCOSE 143* 12/08/2013 1846   BUN 50* 12/08/2013 1846   CREATININE 2.20* 12/08/2013 1846   CALCIUM 8.5 12/04/2013 0448   PROT 5.8* 11/29/2013 0450   ALBUMIN 2.7* 11/29/2013 0450   AST 27 11/29/2013 0450   ALT 21 11/29/2013 0450   ALKPHOS 59 11/29/2013 0450   BILITOT 1.2 11/29/2013 0450   GFRNONAA 39* 12/04/2013 0448   GFRAA 46* 12/04/2013 0448    Assessment and Plan  DEHYDRATION- from poor po intake and over diuresis; BUN/Cr was 50/2.2 at hosp.pt received 1250 cc fluid on 1/2-3; restarting Lasix at 20 mg po daily which is a small dose but hopefully will keep pot out of trouble from CHF and won't dry him out; already ordered BMP every Monday  POOR SWALLOWING OR NO SWALLOWING- per ST when he swallows anything he coughs; all of his liquids would have to be honey thick which really doesn't give him any fluid;will need to get with son to discuss options; this will be a life changing discussion  RIB FRACTURES- right 9 and 10 - pt seems not to be in pain   Margit HanksALEXANDER, Dayle Sherpa D, MD

## 2013-12-14 ENCOUNTER — Encounter (HOSPITAL_COMMUNITY): Payer: Medicare Other

## 2013-12-15 LAB — CULTURE, BLOOD (ROUTINE X 2)
CULTURE: NO GROWTH
Culture: NO GROWTH

## 2014-01-07 DEATH — deceased

## 2015-03-25 IMAGING — CT CT HEAD W/O CM
1 series · 16 of 30 positions shown, 20 images · non-contrast
Comparison: MRI brain dated 07/14/2006

CLINICAL DATA: Altered mental status, history of stroke

EXAM:
CT HEAD WITHOUT CONTRAST
TECHNIQUE: Contiguous axial images were obtained from the base of the skull
through the vertex without intravenous contrast.

[Series 2: head 5.0 h30s · axial · 0.41mm/px · z∈[-227,-92]mm · 16 of 31 slices shown, 20 images]
[im 2/31  brain]
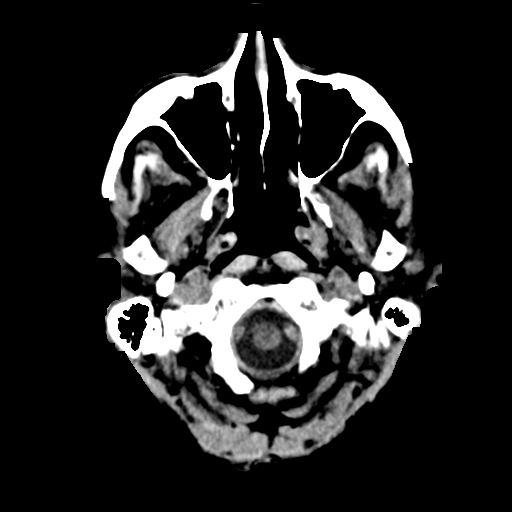
[im 2/31  bone]
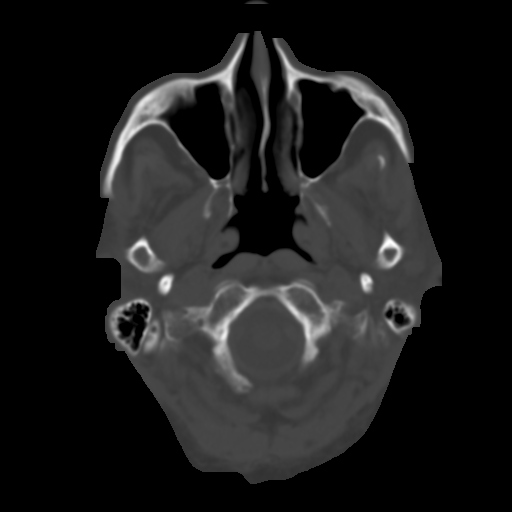
[im 4/31  brain]
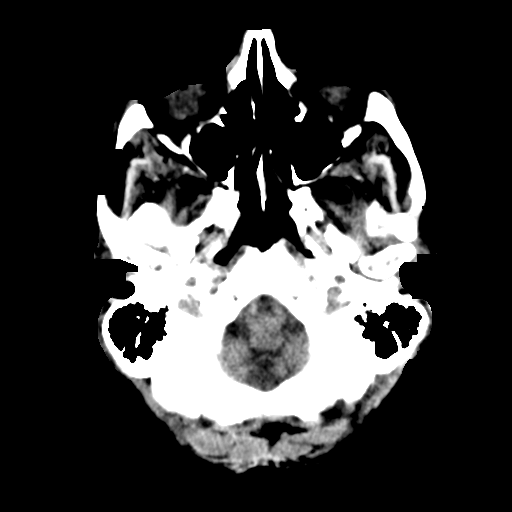
[im 6/31  brain]
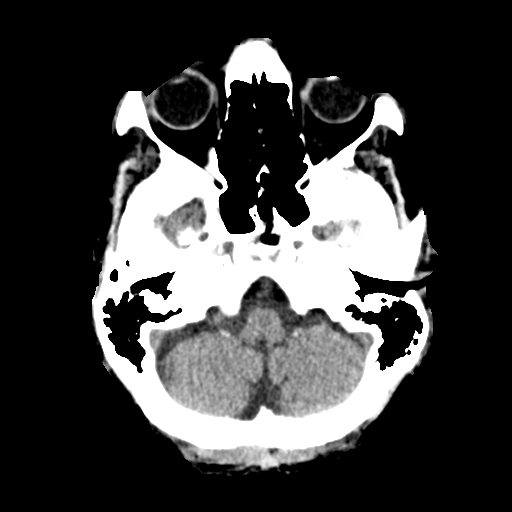
[im 8/31  brain]
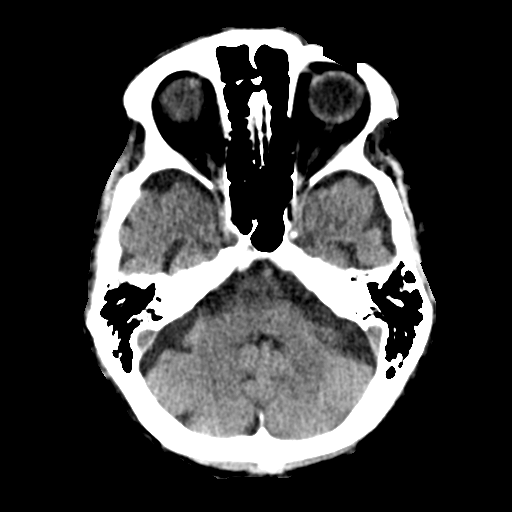
[im 9/31  brain]
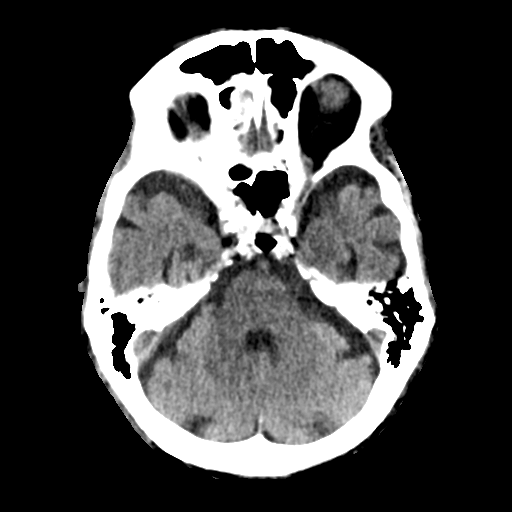
[im 9/31  bone]
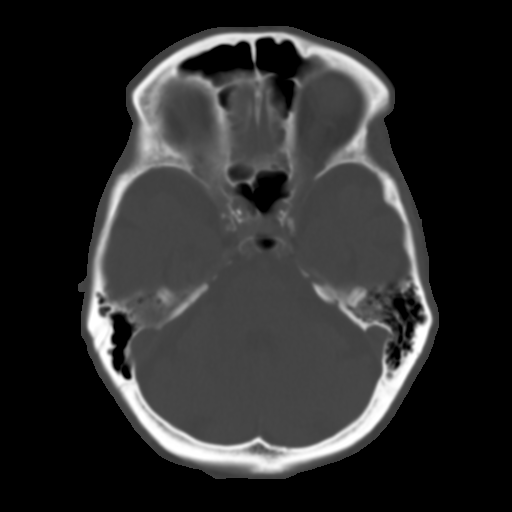
[im 11/31  brain]
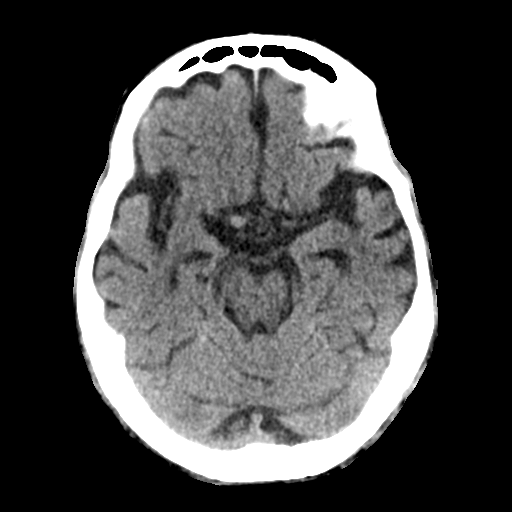
[im 13/31  brain]
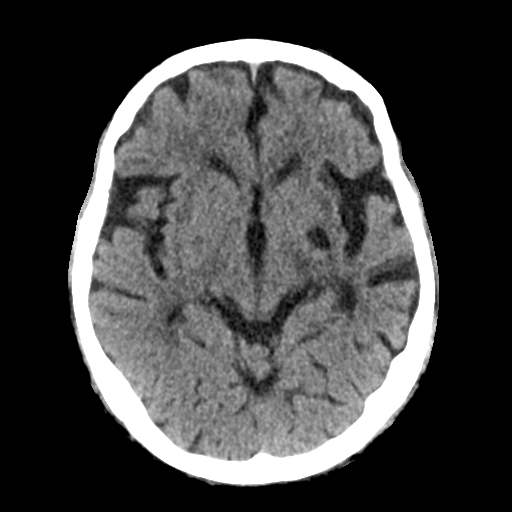
[im 15/31  brain]
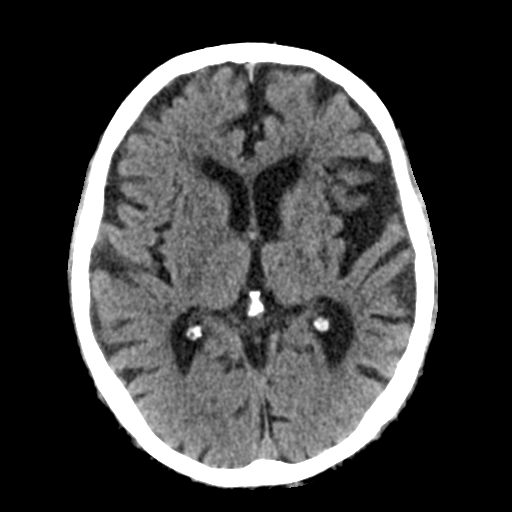
[im 16/31  brain]
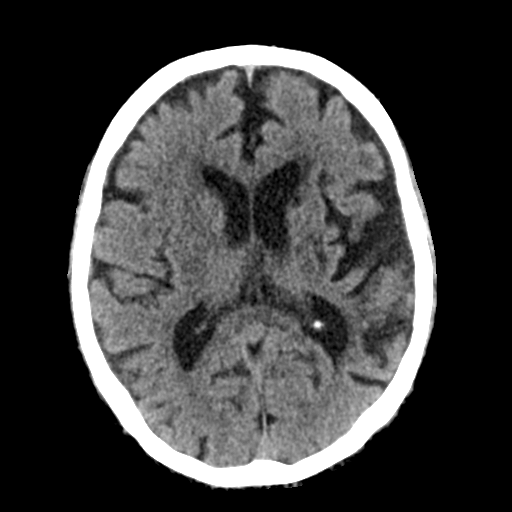
[im 16/31  bone]
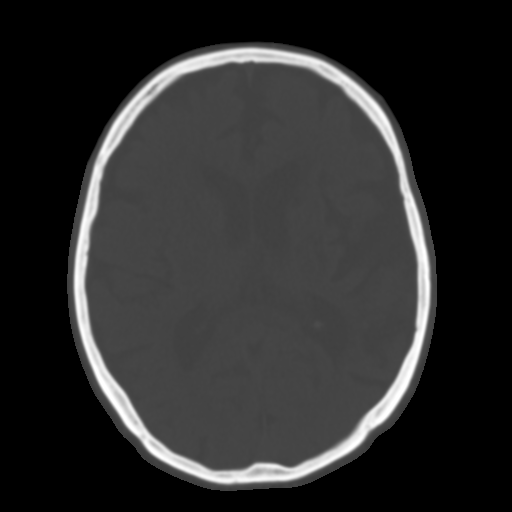
[im 18/31  brain]
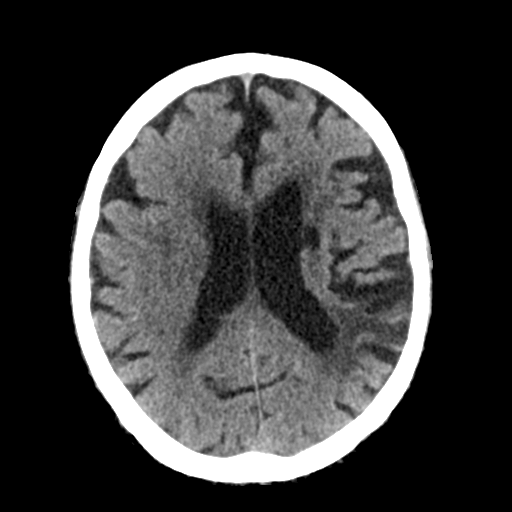
[im 20/31  brain]
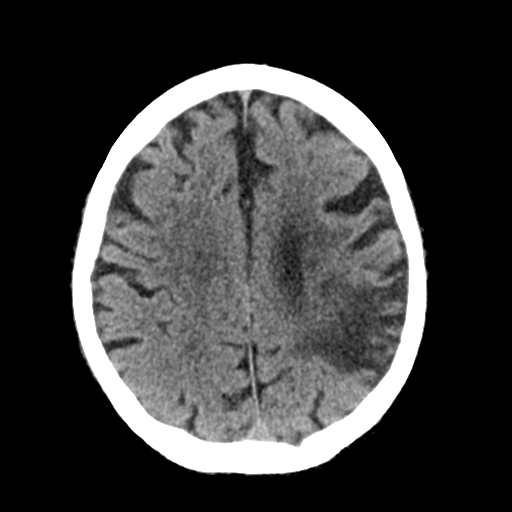
[im 22/31  brain]
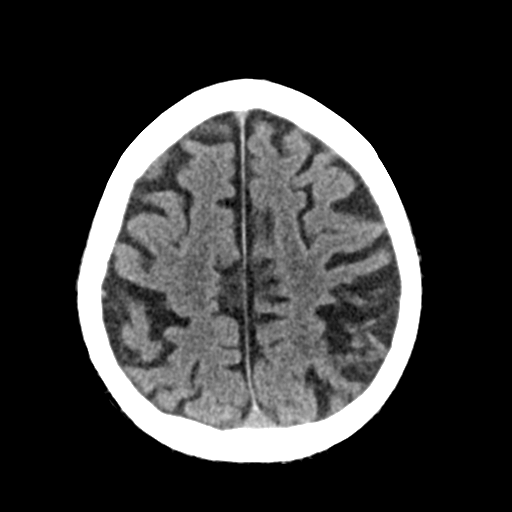
[im 23/31  brain]
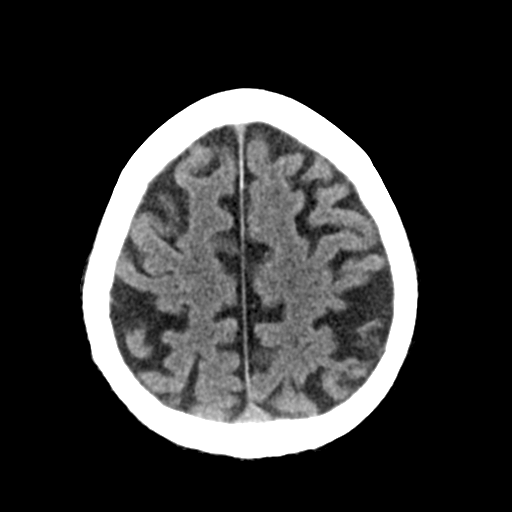
[im 23/31  bone]
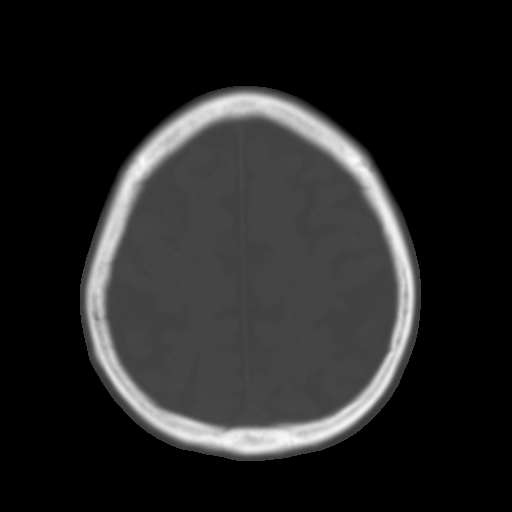
[im 25/31  brain]
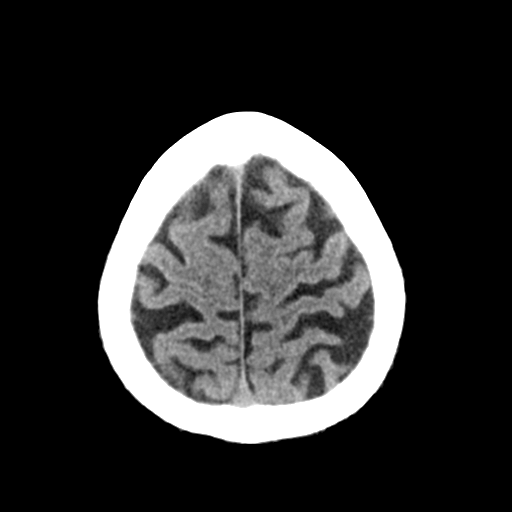
[im 27/31  brain]
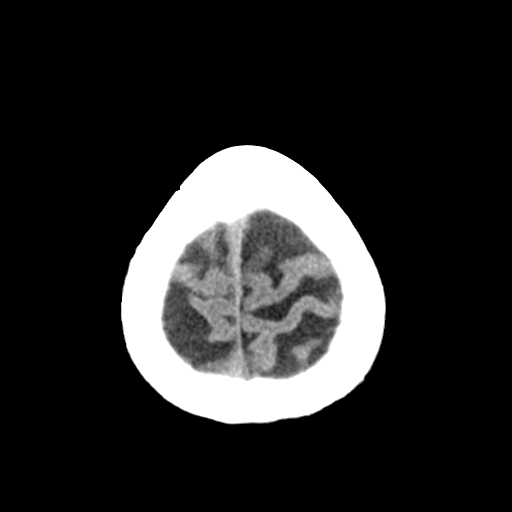
[im 29/31  brain]
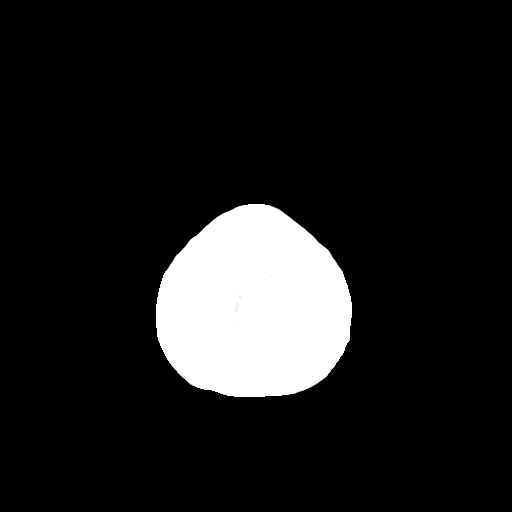

[16 of 30 positions shown; findings below may reference images not displayed]

FINDINGS: No evidence of parenchymal hemorrhage or extra-axial fluid
collection. No mass lesion, mass effect, or midline shift.

No CT evidence of acute infarction. Encephalomalacic changes related
to prior left MCA distribution infarct.

Subcortical white matter and periventricular small vessel ischemic
changes. Intracranial atherosclerosis.

Global cortical atrophy.  No ventriculomegaly.

The visualized paranasal sinuses are essentially clear. The mastoid
air cells are unopacified.

No evidence of calvarial fracture.
IMPRESSION: No evidence of acute intracranial abnormality.

Encephalomalacic changes related to prior left MCA distribution
infarct.

Atrophy with small vessel ischemic changes and intracranial
atherosclerosis.

## 2015-03-25 IMAGING — CR DG CHEST 1V PORT
1 series · 1 of 1 positions shown · non-contrast
Comparison: None.

CLINICAL DATA: Acute mental status changes.

EXAM:
PORTABLE CHEST - 1 VIEW

[AP]
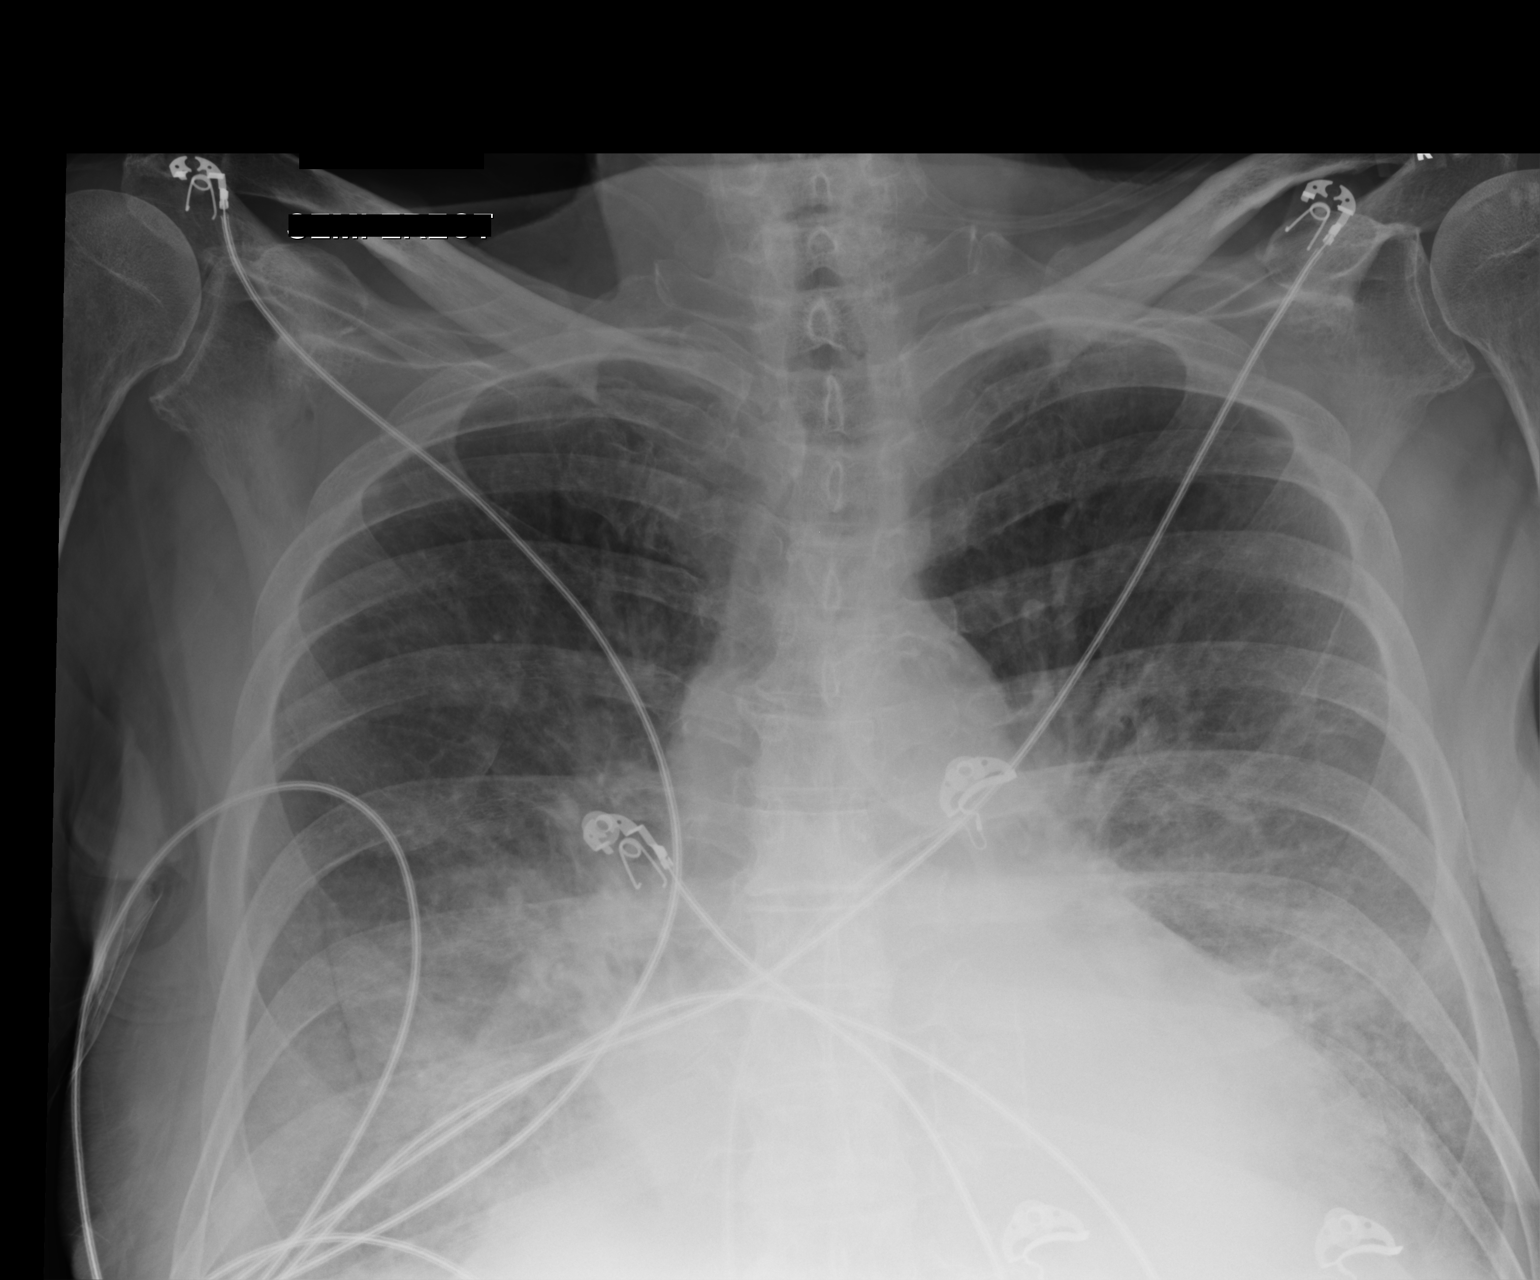

[1 of 1 positions shown; findings below may reference images not displayed]

FINDINGS: The lung bases are not included on this study. Visualized portion of
the long bones demonstrate enlargement of the cardiac silhouette.
There is prominence of the interstitial markings, indistinctness of
the pulmonary vasculature, and peribronchial cuffing. The osseous
structures demonstrate degenerative changes within the right lobe
shoulders. No focal regions of consolidation appreciated.
IMPRESSION: Interstitial infiltrate likely reflecting pulmonary edema within the
visualized portions of the chest.

## 2015-03-28 IMAGING — CR DG CHEST 2V
2 series · 2 of 2 positions shown · non-contrast
Comparison: 11/28/2013

CLINICAL DATA: Cough, shortness of breath

EXAM:
CHEST  2 VIEW

[w chest pa]
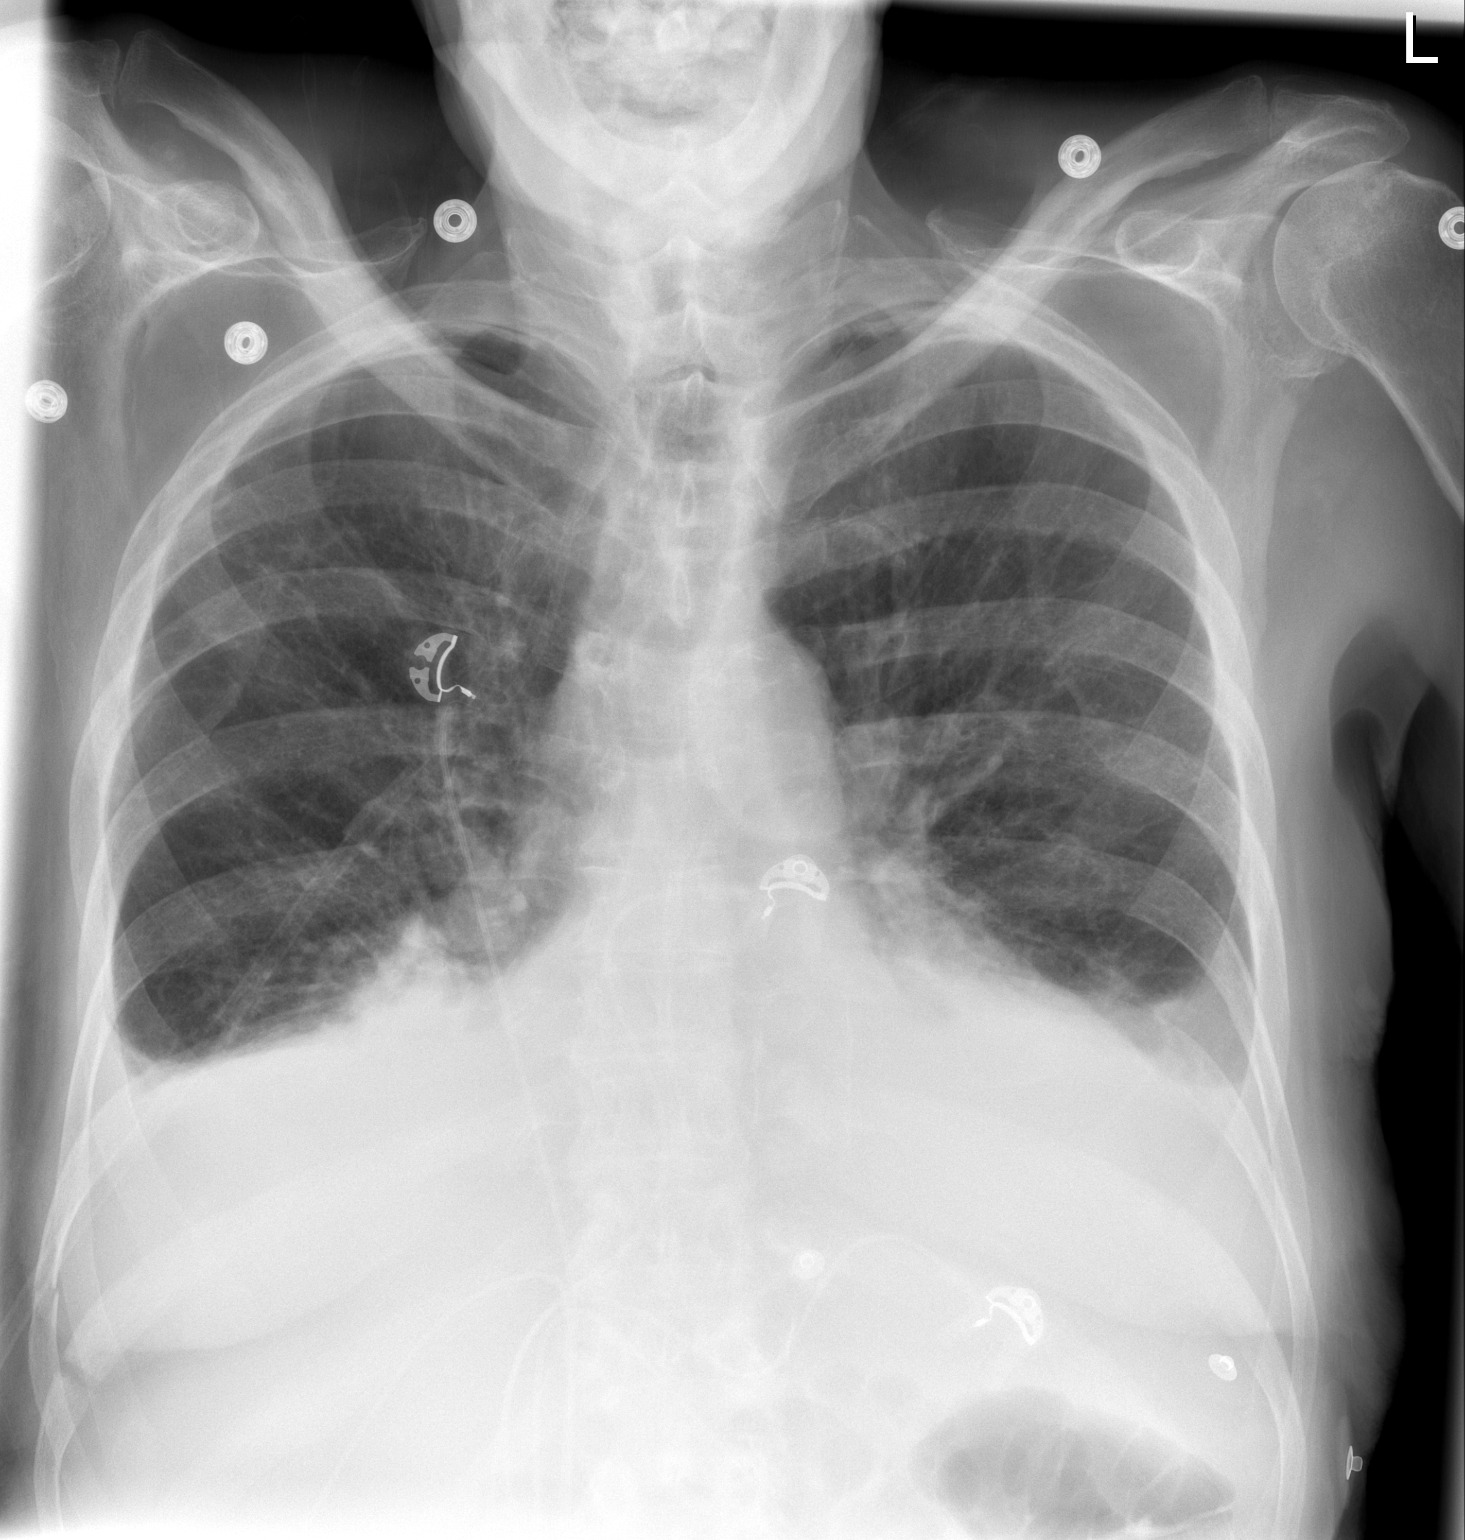

[w chest lat]
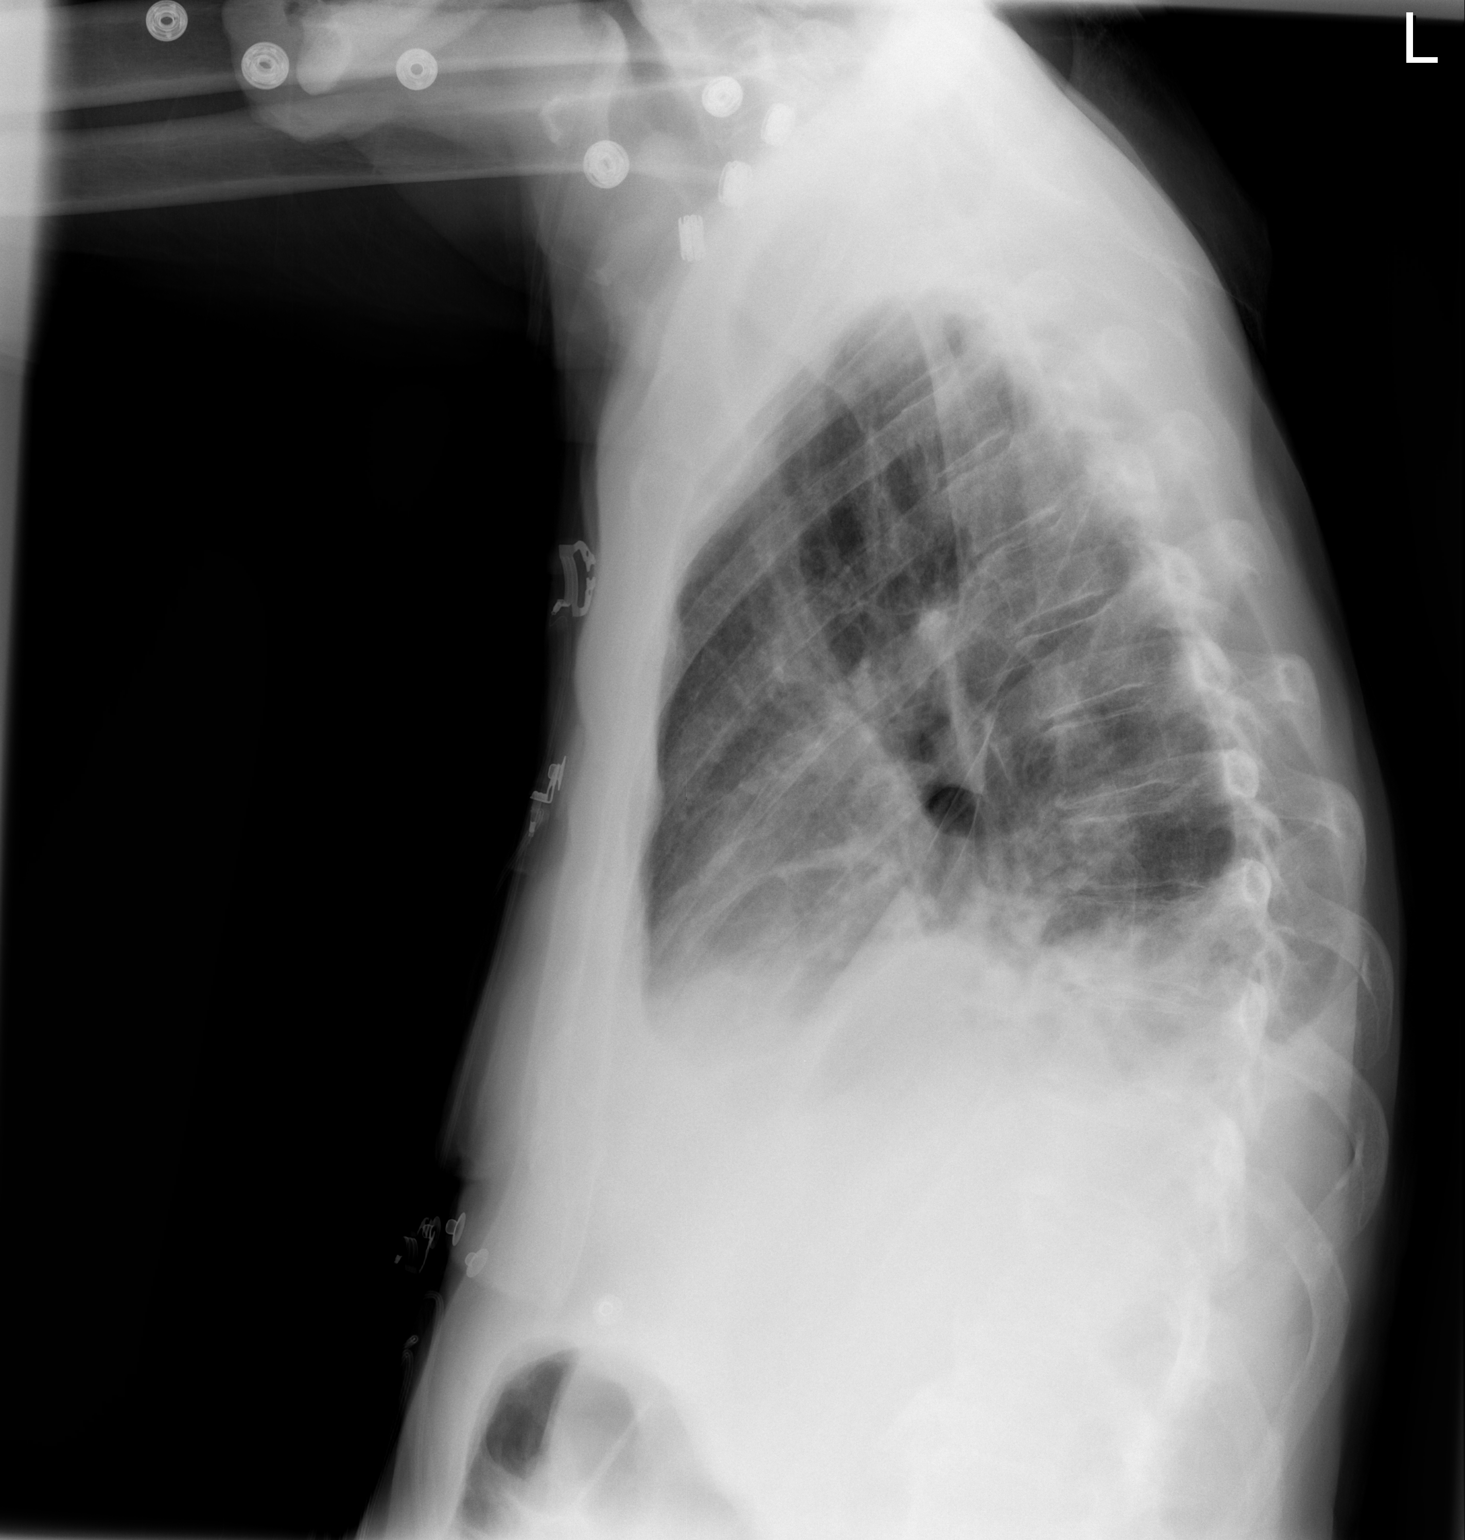

[2 of 2 positions shown; findings below may reference images not displayed]

FINDINGS: The lower borders and cardiac silhouette are obscured. Visualized
portions appear enlarged. Small bilateral pleural effusions are
appreciated which appear to have developed in the interim. There is
decreased conspicuity of the interstitial markings. Mild residual
prominence is appreciated. There are areas of mild increased density
within the lung bases with a focal area medially on the right. The
osseous structures unremarkable.
IMPRESSION: 1. Interval development of bilateral effusions
2. Improvement in interstitial findings consistent with decreased
pulmonary edema
3. Atelectasis versus infiltrate within the lung bases. Focal
density projects within the left lung base with differential
considerations of a focal region of consolidation no a mass within
this area cannot be excluded. Continued surveillance evaluation is
recommended.
